# Patient Record
Sex: Female | Born: 1935 | Race: White | Hispanic: No | Marital: Married | Smoking: Never smoker
Health system: Southern US, Community
[De-identification: ages and names within clinical notes are randomized; demographics above are authoritative.]

## PROBLEM LIST (undated history)

## (undated) DIAGNOSIS — K219 Gastro-esophageal reflux disease without esophagitis: Secondary | ICD-10-CM

## (undated) DIAGNOSIS — M1990 Unspecified osteoarthritis, unspecified site: Secondary | ICD-10-CM

## (undated) DIAGNOSIS — I10 Essential (primary) hypertension: Secondary | ICD-10-CM

## (undated) DIAGNOSIS — R059 Cough, unspecified: Secondary | ICD-10-CM

## (undated) DIAGNOSIS — M810 Age-related osteoporosis without current pathological fracture: Secondary | ICD-10-CM

## (undated) DIAGNOSIS — K589 Irritable bowel syndrome without diarrhea: Secondary | ICD-10-CM

## (undated) DIAGNOSIS — E041 Nontoxic single thyroid nodule: Secondary | ICD-10-CM

## (undated) DIAGNOSIS — R05 Cough: Secondary | ICD-10-CM

## (undated) DIAGNOSIS — R51 Headache: Secondary | ICD-10-CM

## (undated) DIAGNOSIS — I739 Peripheral vascular disease, unspecified: Secondary | ICD-10-CM

## (undated) HISTORY — DX: Peripheral vascular disease, unspecified: I73.9

## (undated) HISTORY — DX: Irritable bowel syndrome, unspecified: K58.9

## (undated) HISTORY — PX: EYE SURGERY: SHX253

## (undated) HISTORY — DX: Age-related osteoporosis without current pathological fracture: M81.0

## (undated) HISTORY — PX: TUBAL LIGATION: SHX77

## (undated) HISTORY — PX: HAND SURGERY: SHX662

## (undated) HISTORY — PX: CHOLECYSTECTOMY: SHX55

## (undated) HISTORY — PX: WRIST SURGERY: SHX841

## (undated) HISTORY — PX: TONSILLECTOMY: SUR1361

## (undated) HISTORY — DX: Cough, unspecified: R05.9

## (undated) HISTORY — PX: APPENDECTOMY: SHX54

## (undated) HISTORY — DX: Cough: R05

---

## 2012-02-14 ENCOUNTER — Encounter (HOSPITAL_COMMUNITY): Payer: Self-pay

## 2012-02-15 ENCOUNTER — Encounter (HOSPITAL_COMMUNITY)
Admission: RE | Admit: 2012-02-15 | Discharge: 2012-02-15 | Disposition: A | Discharge status: Home / Self Care | Payer: Medicare Other | Source: Ambulatory Visit | Attending: Orthopedic Surgery | Admitting: Orthopedic Surgery

## 2012-02-15 ENCOUNTER — Encounter (HOSPITAL_COMMUNITY): Payer: Self-pay

## 2012-02-15 ENCOUNTER — Ambulatory Visit (HOSPITAL_COMMUNITY)
Admission: RE | Admit: 2012-02-15 | Discharge: 2012-02-15 | Disposition: A | Discharge status: Home / Self Care | Payer: Medicare Other | Source: Ambulatory Visit | Attending: Orthopedic Surgery | Admitting: Orthopedic Surgery

## 2012-02-15 DIAGNOSIS — S42213A Unspecified displaced fracture of surgical neck of unspecified humerus, initial encounter for closed fracture: Secondary | ICD-10-CM | POA: Insufficient documentation

## 2012-02-15 DIAGNOSIS — X58XXXA Exposure to other specified factors, initial encounter: Secondary | ICD-10-CM | POA: Insufficient documentation

## 2012-02-15 DIAGNOSIS — Z01818 Encounter for other preprocedural examination: Secondary | ICD-10-CM | POA: Insufficient documentation

## 2012-02-15 DIAGNOSIS — Z01812 Encounter for preprocedural laboratory examination: Secondary | ICD-10-CM | POA: Insufficient documentation

## 2012-02-15 DIAGNOSIS — Z01810 Encounter for preprocedural cardiovascular examination: Secondary | ICD-10-CM | POA: Insufficient documentation

## 2012-02-15 HISTORY — DX: Gastro-esophageal reflux disease without esophagitis: K21.9

## 2012-02-15 HISTORY — DX: Headache: R51

## 2012-02-15 HISTORY — DX: Unspecified osteoarthritis, unspecified site: M19.90

## 2012-02-15 HISTORY — DX: Essential (primary) hypertension: I10

## 2012-02-15 HISTORY — DX: Nontoxic single thyroid nodule: E04.1

## 2012-02-15 LAB — BASIC METABOLIC PANEL
Calcium: 8.9 mg/dL (ref 8.4–10.5)
GFR calc non Af Amer: >90 mL/min (ref >90)
Glucose, Bld: 108 mg/dL — ABNORMAL HIGH (ref 70–99)
Sodium: 132 mEq/L — ABNORMAL LOW (ref 135–145)

## 2012-02-15 LAB — CBC
MCH: 31.3 pg (ref 26.0–34.0)
Platelets: 370 10*3/uL (ref 150–400)
RBC: 3.36 MIL/uL — ABNORMAL LOW (ref 3.87–5.11)

## 2012-02-15 LAB — ABO/RH: ABO/RH(D): AB POS

## 2012-02-15 LAB — TYPE AND SCREEN: ABO/RH(D): AB POS

## 2012-02-15 NOTE — Pre-Procedure Instructions (Addendum)
[address] COHEN DOLEMAN  02/15/2012   Your procedure is scheduled on: 02/17/12  Report to [facility] at 800AM.  Call this number if you have problems the morning of surgery: [phone]   Remember:   Do not eat food or drink:After Midnight.    Take these medicines the morning of surgery with A SIP OF WATER: refluxin, pain med  STOP aspirin and ALL over the counter meds now   Do not wear jewelry, make-up or nail polish.  Do not wear lotions, powders, or perfumes. You may wear deodorant.  Do not shave 48 hours prior to surgery. Men may shave face and neck.  Do not bring valuables to the hospital.  Contacts, dentures or bridgework may not be worn into surgery.  Leave suitcase in the car. After surgery it may be brought to your room.  For patients admitted to the hospital, checkout time is 11:00 AM the day of discharge.   Patients discharged the day of surgery will not be allowed to drive home.  Name and phone number of your driver: Alden Server spouse [phone]  Special Instructions: Incentive Spirometry - Practice and bring it with you on the day of surgery. and CHG Shower Use Special Wash: 1/2 bottle night before surgery and 1/2 bottle morning of surgery.   Please read over the following fact sheets that you were given: Pain Booklet, Coughing and Deep Breathing, Blood Transfusion Information, MRSA Information and Surgical Site Infection Prevention

## 2012-02-15 NOTE — Progress Notes (Signed)
req'd stress done 3 yrs ago neg from [facility] cardiology

## 2012-02-16 MED ORDER — CEFAZOLIN SODIUM-DEXTROSE 2-3 GM-% IV SOLR
2.0000 g | INTRAVENOUS | Status: AC
  Administered 2012-02-17: 2 g via INTRAVENOUS
  Filled 2012-02-16: qty 50

## 2012-02-16 NOTE — Consult Note (Signed)
Anesthesia chart review: Patient is a 77 year old female scheduled for right shoulder reverse arthroplasty by Dr. Rennis Chris on 02/17/2012.  She has a right proximal humerus fracture.  PCP is Dr. Lucianne Lei [phone]).  Labs noted.  CXR on 02/15/12 showed: 1. No acute cardiopulmonary disease.  2. Hyperexpanded lungs with slightly asymmetric biapical pleural parenchymal thickening, right greater than left.  3. Minimally displaced impacted fracture of the surgical neck of the humerus with inferior dislocation of the humeral head in relation to the glenoid, incompletely evaluated.   EKG on 02/15/12 showed ST @ 106, possible LAE, LAD.  Patient had a stress test in 2010 (done at [facility] in [place], but ordered by PCP).  I left a voicemail for Dr. Novella Rob nurse requesting stress test and EKG as available.  Of note, she was evaluated by Cardiologist Dr. Woodfin Ganja on 09/26/10 for murmur evaluation.  His note mentions that she had a "stress test and Holter monitor several years ago that were all normal. She had an echo 2 years ago that was normal."  His exam indicates that he heard a soft 1/6 ejection murmur that did not suggest any significant valvular disease at that time.  No additional test were ordered then with recommendations for two year follow-up.  If no new/acute CV/CHF symptoms, then anticipate she can proceed as planned.   Shonna Chock, PA-C

## 2012-02-17 ENCOUNTER — Encounter (HOSPITAL_COMMUNITY)
Admission: RE | Disposition: A | Discharge status: Home Health | Payer: Self-pay | Source: Ambulatory Visit | Attending: Orthopedic Surgery

## 2012-02-17 ENCOUNTER — Encounter (HOSPITAL_COMMUNITY): Payer: Self-pay | Admitting: Vascular Surgery

## 2012-02-17 ENCOUNTER — Inpatient Hospital Stay (HOSPITAL_COMMUNITY): Payer: Medicare Other | Admitting: Vascular Surgery

## 2012-02-17 ENCOUNTER — Encounter (HOSPITAL_COMMUNITY): Payer: Self-pay | Admitting: *Deleted

## 2012-02-17 ENCOUNTER — Inpatient Hospital Stay (HOSPITAL_COMMUNITY)
Admission: RE | Admit: 2012-02-17 | Discharge: 2012-02-18 | DRG: 484 | Disposition: A | Discharge status: Home Health | Payer: Medicare Other | Source: Ambulatory Visit | Attending: Orthopedic Surgery | Admitting: Orthopedic Surgery

## 2012-02-17 DIAGNOSIS — Z01810 Encounter for preprocedural cardiovascular examination: Secondary | ICD-10-CM

## 2012-02-17 DIAGNOSIS — S42293A Other displaced fracture of upper end of unspecified humerus, initial encounter for closed fracture: Principal | ICD-10-CM | POA: Diagnosis present

## 2012-02-17 DIAGNOSIS — W19XXXA Unspecified fall, initial encounter: Secondary | ICD-10-CM | POA: Diagnosis present

## 2012-02-17 DIAGNOSIS — K219 Gastro-esophageal reflux disease without esophagitis: Secondary | ICD-10-CM | POA: Diagnosis present

## 2012-02-17 DIAGNOSIS — I10 Essential (primary) hypertension: Secondary | ICD-10-CM | POA: Diagnosis present

## 2012-02-17 DIAGNOSIS — Z01812 Encounter for preprocedural laboratory examination: Secondary | ICD-10-CM

## 2012-02-17 DIAGNOSIS — S42209A Unspecified fracture of upper end of unspecified humerus, initial encounter for closed fracture: Secondary | ICD-10-CM

## 2012-02-17 DIAGNOSIS — Z01818 Encounter for other preprocedural examination: Secondary | ICD-10-CM

## 2012-02-17 DIAGNOSIS — M129 Arthropathy, unspecified: Secondary | ICD-10-CM | POA: Diagnosis present

## 2012-02-17 HISTORY — PX: REVERSE SHOULDER ARTHROPLASTY: SHX5054

## 2012-02-17 SURGERY — ARTHROPLASTY, SHOULDER, TOTAL, REVERSE
Anesthesia: General | Site: Shoulder | Laterality: Right | Wound class: Clean

## 2012-02-17 MED ORDER — CEFAZOLIN SODIUM 1-5 GM-% IV SOLN
1.0000 g | Freq: Four times a day (QID) | INTRAVENOUS | Status: AC
  Administered 2012-02-17 – 2012-02-18 (×3): 1 g via INTRAVENOUS
  Filled 2012-02-17 (×3): qty 50

## 2012-02-17 MED ORDER — ONDANSETRON HCL 4 MG/2ML IJ SOLN
INTRAMUSCULAR | Status: DC | PRN
  Administered 2012-02-17: 4 mg via INTRAVENOUS

## 2012-02-17 MED ORDER — PHENOL 1.4 % MT LIQD: 1.0000 | OROMUCOSAL | Status: DC | PRN

## 2012-02-17 MED ORDER — METOCLOPRAMIDE HCL 5 MG/ML IJ SOLN: 5.0000 mg | Freq: Three times a day (TID) | INTRAMUSCULAR | Status: DC | PRN

## 2012-02-17 MED ORDER — FENTANYL CITRATE 0.05 MG/ML IJ SOLN
INTRAMUSCULAR | Status: DC | PRN
  Administered 2012-02-17: 100 ug via INTRAVENOUS

## 2012-02-17 MED ORDER — ONDANSETRON HCL 4 MG/2ML IJ SOLN: 4.0000 mg | Freq: Four times a day (QID) | INTRAMUSCULAR | Status: DC | PRN

## 2012-02-17 MED ORDER — PHENYLEPHRINE HCL 10 MG/ML IJ SOLN
INTRAMUSCULAR | Status: DC | PRN
  Administered 2012-02-17: 120 ug via INTRAVENOUS
  Administered 2012-02-17: 80 ug via INTRAVENOUS

## 2012-02-17 MED ORDER — KETOROLAC TROMETHAMINE 15 MG/ML IJ SOLN
15.0000 mg | Freq: Four times a day (QID) | INTRAMUSCULAR | Status: DC
  Administered 2012-02-17 – 2012-02-18 (×3): 15 mg via INTRAVENOUS
  Filled 2012-02-17 (×7): qty 1

## 2012-02-17 MED ORDER — MUPIROCIN 2 % EX OINT
1.0000 "application " | TOPICAL_OINTMENT | Freq: Two times a day (BID) | CUTANEOUS | Status: DC
  Administered 2012-02-17 – 2012-02-18 (×2): 1 via NASAL
  Filled 2012-02-17: qty 22

## 2012-02-17 MED ORDER — TEMAZEPAM 15 MG PO CAPS: 15.0000 mg | ORAL_CAPSULE | Freq: Every evening | ORAL | Status: DC | PRN

## 2012-02-17 MED ORDER — ACETAMINOPHEN 325 MG PO TABS: 650.0000 mg | ORAL_TABLET | Freq: Four times a day (QID) | ORAL | Status: DC | PRN

## 2012-02-17 MED ORDER — CHLORHEXIDINE GLUCONATE 4 % EX LIQD: 60.0000 mL | Freq: Once | CUTANEOUS | Status: DC

## 2012-02-17 MED ORDER — ACETAMINOPHEN 650 MG RE SUPP: 650.0000 mg | Freq: Four times a day (QID) | RECTAL | Status: DC | PRN

## 2012-02-17 MED ORDER — HYDROMORPHONE HCL PF 1 MG/ML IJ SOLN: 0.2500 mg | INTRAMUSCULAR | Status: DC | PRN

## 2012-02-17 MED ORDER — DOCUSATE SODIUM 100 MG PO CAPS
100.0000 mg | ORAL_CAPSULE | Freq: Two times a day (BID) | ORAL | Status: DC
  Administered 2012-02-17: 100 mg via ORAL
  Filled 2012-02-17 (×3): qty 1

## 2012-02-17 MED ORDER — METHOCARBAMOL 500 MG PO TABS
500.0000 mg | ORAL_TABLET | Freq: Four times a day (QID) | ORAL | Status: DC | PRN
  Administered 2012-02-18: 500 mg via ORAL
  Filled 2012-02-17 (×2): qty 1

## 2012-02-17 MED ORDER — OXYCODONE-ACETAMINOPHEN 5-325 MG PO TABS
1.0000 | ORAL_TABLET | ORAL | Status: DC | PRN
  Administered 2012-02-18 (×2): 2 via ORAL
  Filled 2012-02-17 (×2): qty 2

## 2012-02-17 MED ORDER — METOCLOPRAMIDE HCL 10 MG PO TABS: 5.0000 mg | ORAL_TABLET | Freq: Three times a day (TID) | ORAL | Status: DC | PRN

## 2012-02-17 MED ORDER — ONDANSETRON HCL 4 MG PO TABS: 4.0000 mg | ORAL_TABLET | Freq: Four times a day (QID) | ORAL | Status: DC | PRN

## 2012-02-17 MED ORDER — MIDAZOLAM HCL 2 MG/2ML IJ SOLN
INTRAMUSCULAR | Status: AC
  Filled 2012-02-17: qty 2

## 2012-02-17 MED ORDER — LACTATED RINGERS IV SOLN
INTRAVENOUS | Status: DC | PRN
  Administered 2012-02-17 (×2): via INTRAVENOUS

## 2012-02-17 MED ORDER — VANCOMYCIN HCL 1000 MG IV SOLR
1000.0000 mg | INTRAVENOUS | Status: DC | PRN
  Administered 2012-02-17: 1000 mg via INTRAVENOUS

## 2012-02-17 MED ORDER — PROPOFOL 10 MG/ML IV BOLUS
INTRAVENOUS | Status: DC | PRN
  Administered 2012-02-17: 140 mg via INTRAVENOUS

## 2012-02-17 MED ORDER — LOSARTAN POTASSIUM 25 MG PO TABS
25.0000 mg | ORAL_TABLET | Freq: Every day | ORAL | Status: DC
  Filled 2012-02-17 (×2): qty 1

## 2012-02-17 MED ORDER — HYDROMORPHONE HCL PF 1 MG/ML IJ SOLN
0.5000 mg | INTRAMUSCULAR | Status: DC | PRN
  Administered 2012-02-18: 1 mg via INTRAVENOUS
  Filled 2012-02-17: qty 1

## 2012-02-17 MED ORDER — MENTHOL 3 MG MT LOZG: 1.0000 | LOZENGE | OROMUCOSAL | Status: DC | PRN

## 2012-02-17 MED ORDER — SODIUM CHLORIDE 0.9 % IR SOLN
Status: DC | PRN
  Administered 2012-02-17: 1000 mL

## 2012-02-17 MED ORDER — PHENYLEPHRINE HCL 10 MG/ML IJ SOLN
10.0000 mg | INTRAVENOUS | Status: DC | PRN
  Administered 2012-02-17: 50 ug/min via INTRAVENOUS

## 2012-02-17 MED ORDER — LIDOCAINE HCL (CARDIAC) 20 MG/ML IV SOLN
INTRAVENOUS | Status: DC | PRN
  Administered 2012-02-17: 80 mg via INTRAVENOUS

## 2012-02-17 MED ORDER — MIDAZOLAM HCL 5 MG/ML IJ SOLN
1.0000 mg | Freq: Once | INTRAMUSCULAR | Status: AC
  Administered 2012-02-17: 1 mg via INTRAVENOUS

## 2012-02-17 MED ORDER — LACTATED RINGERS IV SOLN
INTRAVENOUS | Status: DC
  Administered 2012-02-17: 1000 mL via INTRAVENOUS
  Administered 2012-02-18: 03:00:00 via INTRAVENOUS

## 2012-02-17 MED ORDER — VANCOMYCIN HCL IN DEXTROSE 1-5 GM/200ML-% IV SOLN
INTRAVENOUS | Status: AC
  Filled 2012-02-17: qty 200

## 2012-02-17 MED ORDER — ALUM & MAG HYDROXIDE-SIMETH 200-200-20 MG/5ML PO SUSP: 30.0000 mL | ORAL | Status: DC | PRN

## 2012-02-17 MED ORDER — FENTANYL CITRATE 0.05 MG/ML IJ SOLN
50.0000 ug | Freq: Once | INTRAMUSCULAR | Status: AC
  Administered 2012-02-17: 50 ug via INTRAVENOUS

## 2012-02-17 MED ORDER — ROCURONIUM BROMIDE 100 MG/10ML IV SOLN
INTRAVENOUS | Status: DC | PRN
  Administered 2012-02-17: 50 mg via INTRAVENOUS

## 2012-02-17 MED ORDER — NEOSTIGMINE METHYLSULFATE 1 MG/ML IJ SOLN
INTRAMUSCULAR | Status: DC | PRN
  Administered 2012-02-17: 3 mg via INTRAVENOUS

## 2012-02-17 MED ORDER — LACTATED RINGERS IV SOLN
INTRAVENOUS | Status: DC
  Administered 2012-02-17: 50 mL/h via INTRAVENOUS

## 2012-02-17 MED ORDER — FENTANYL CITRATE 0.05 MG/ML IJ SOLN
INTRAMUSCULAR | Status: AC
  Filled 2012-02-17: qty 2

## 2012-02-17 MED ORDER — DIPHENHYDRAMINE HCL 12.5 MG/5ML PO ELIX: 12.5000 mg | ORAL_SOLUTION | ORAL | Status: DC | PRN

## 2012-02-17 MED ORDER — METHOCARBAMOL 100 MG/ML IJ SOLN
500.0000 mg | Freq: Four times a day (QID) | INTRAMUSCULAR | Status: DC | PRN
  Administered 2012-02-17: 500 mg via INTRAVENOUS
  Filled 2012-02-17: qty 5

## 2012-02-17 MED ORDER — BUPIVACAINE-EPINEPHRINE PF 0.5-1:200000 % IJ SOLN
INTRAMUSCULAR | Status: DC | PRN
  Administered 2012-02-17: 30 mL

## 2012-02-17 MED ORDER — GLYCOPYRROLATE 0.2 MG/ML IJ SOLN
INTRAMUSCULAR | Status: DC | PRN
  Administered 2012-02-17: 0.4 mg via INTRAVENOUS

## 2012-02-17 SURGICAL SUPPLY — 75 items
BIT DRILL 170X2.5X (BIT) IMPLANT
BIT DRL 170X2.5X (BIT)
BLADE SAW SGTL 83.5X18.5 (BLADE) ×2 IMPLANT
BRUSH FEMORAL CANAL (MISCELLANEOUS) IMPLANT
CEMENT BONE DEPUY (Cement) ×4 IMPLANT
CEMENT RESTRICTOR DEPUY SZ 3 (Cement) ×2 IMPLANT
CLOTH BEACON ORANGE TIMEOUT ST (SAFETY) ×2 IMPLANT
CLSR STERI-STRIP ANTIMIC 1/2X4 (GAUZE/BANDAGES/DRESSINGS) ×2 IMPLANT
COVER SURGICAL LIGHT HANDLE (MISCELLANEOUS) ×2 IMPLANT
DRAPE INCISE IOBAN 66X45 STRL (DRAPES) ×4 IMPLANT
DRAPE SURG 17X11 SM STRL (DRAPES) ×2 IMPLANT
DRAPE U-SHAPE 47X51 STRL (DRAPES) ×2 IMPLANT
DRILL 2.5 (BIT)
DRILL BIT 7/64X5 (BIT) IMPLANT
DRSG AQUACEL AG ADV 3.5X10 (GAUZE/BANDAGES/DRESSINGS) IMPLANT
DRSG MEPILEX BORDER 4X8 (GAUZE/BANDAGES/DRESSINGS) IMPLANT
DURAPREP 26ML APPLICATOR (WOUND CARE) ×2 IMPLANT
ELECT BLADE 4.0 EZ CLEAN MEGAD (MISCELLANEOUS) ×2
ELECT CAUTERY BLADE 6.4 (BLADE) ×2 IMPLANT
ELECT REM PT RETURN 9FT ADLT (ELECTROSURGICAL) ×2
ELECTRODE BLDE 4.0 EZ CLN MEGD (MISCELLANEOUS) ×1 IMPLANT
ELECTRODE REM PT RTRN 9FT ADLT (ELECTROSURGICAL) ×1 IMPLANT
FACESHIELD LNG OPTICON STERILE (SAFETY) ×6 IMPLANT
GLOVE BIO SURGEON STRL SZ7.5 (GLOVE) ×2 IMPLANT
GLOVE BIO SURGEON STRL SZ8 (GLOVE) ×2 IMPLANT
GLOVE BIO SURGEON STRL SZ8.5 (GLOVE) ×2 IMPLANT
GLOVE BIOGEL PI IND STRL 8 (GLOVE) ×1 IMPLANT
GLOVE BIOGEL PI INDICATOR 8 (GLOVE) ×1
GLOVE EUDERMIC 7 POWDERFREE (GLOVE) ×2 IMPLANT
GLOVE SS BIOGEL STRL SZ 7.5 (GLOVE) ×1 IMPLANT
GLOVE SUPERSENSE BIOGEL SZ 7.5 (GLOVE) ×1
GLOVE SURG SS PI 7.5 STRL IVOR (GLOVE) ×2 IMPLANT
GLOVE SURG SS PI 8.5 STRL IVOR (GLOVE) ×1
GLOVE SURG SS PI 8.5 STRL STRW (GLOVE) ×1 IMPLANT
GOWN PREVENTION PLUS XXLARGE (GOWN DISPOSABLE) ×2 IMPLANT
GOWN STRL NON-REIN LRG LVL3 (GOWN DISPOSABLE) IMPLANT
GOWN STRL REIN XL XLG (GOWN DISPOSABLE) ×6 IMPLANT
HANDPIECE INTERPULSE COAX TIP (DISPOSABLE)
KIT BASIN OR (CUSTOM PROCEDURE TRAY) ×2 IMPLANT
KIT ROOM TURNOVER OR (KITS) ×2 IMPLANT
MANIFOLD NEPTUNE II (INSTRUMENTS) ×2 IMPLANT
NDL SUT 6 .5 CRC .975X.05 MAYO (NEEDLE) ×1 IMPLANT
NEEDLE HYPO 25GX1X1/2 BEV (NEEDLE) IMPLANT
NEEDLE MAYO TAPER (NEEDLE) ×1
NS IRRIG 1000ML POUR BTL (IV SOLUTION) ×2 IMPLANT
PACK SHOULDER (CUSTOM PROCEDURE TRAY) ×2 IMPLANT
PAD ARMBOARD 7.5X6 YLW CONV (MISCELLANEOUS) ×4 IMPLANT
PASSER SUT SWANSON 36MM LOOP (INSTRUMENTS) IMPLANT
PIN GUIDE 1.2 (PIN) IMPLANT
PIN GUIDE GLENOPHERE 1.5MX300M (PIN) IMPLANT
PIN METAGLENE 2.5 (PIN) IMPLANT
PRESSURIZER FEMORAL UNIV (MISCELLANEOUS) IMPLANT
SET HNDPC FAN SPRY TIP SCT (DISPOSABLE) IMPLANT
SLING ARM IMMOBILIZER LRG (SOFTGOODS) ×2 IMPLANT
SPONGE LAP 18X18 X RAY DECT (DISPOSABLE) ×2 IMPLANT
SPONGE LAP 4X18 X RAY DECT (DISPOSABLE) ×2 IMPLANT
STRIP CLOSURE SKIN 1/2X4 (GAUZE/BANDAGES/DRESSINGS) IMPLANT
SUCTION FRAZIER TIP 10 FR DISP (SUCTIONS) ×2 IMPLANT
SUT BONE WAX W31G (SUTURE) IMPLANT
SUT FIBERWIRE #2 38 T-5 BLUE (SUTURE) ×8
SUT MNCRL AB 3-0 PS2 18 (SUTURE) ×2 IMPLANT
SUT VIC AB 1 CT1 27 (SUTURE) ×1
SUT VIC AB 1 CT1 27XBRD ANBCTR (SUTURE) ×1 IMPLANT
SUT VIC AB 2-0 CT1 27 (SUTURE) ×1
SUT VIC AB 2-0 CT1 TAPERPNT 27 (SUTURE) ×1 IMPLANT
SUT VIC AB 2-0 SH 27 (SUTURE)
SUT VIC AB 2-0 SH 27X BRD (SUTURE) IMPLANT
SUTURE FIBERWR #2 38 T-5 BLUE (SUTURE) ×4 IMPLANT
SYR 30ML SLIP (SYRINGE) IMPLANT
SYR CONTROL 10ML LL (SYRINGE) IMPLANT
TOWEL OR 17X24 6PK STRL BLUE (TOWEL DISPOSABLE) ×2 IMPLANT
TOWEL OR 17X26 10 PK STRL BLUE (TOWEL DISPOSABLE) ×2 IMPLANT
TOWER CARTRIDGE SMART MIX (DISPOSABLE) ×2 IMPLANT
TRAY FOLEY CATH 14FR (SET/KITS/TRAYS/PACK) IMPLANT
WATER STERILE IRR 1000ML POUR (IV SOLUTION) ×2 IMPLANT

## 2012-02-17 NOTE — Anesthesia Preprocedure Evaluation (Addendum)
Anesthesia Evaluation  Patient identified by MRN, date of birth, ID band Patient awake    Reviewed: Allergy & Precautions, H&P , NPO status , Patient's Chart, lab work & pertinent test results, reviewed documented beta blocker date and time   Airway Mallampati: II  Neck ROM: full    Dental  (+) Teeth Intact and Dental Advisory Given   Pulmonary          Cardiovascular hypertension, Pt. on medications     Neuro/Psych  Headaches,    GI/Hepatic GERD-  Medicated and Controlled,  Endo/Other  Morbid obesity  Renal/GU      Musculoskeletal  (+) Arthritis -,   Abdominal   Peds  Hematology   Anesthesia Other Findings   Reproductive/Obstetrics                          Anesthesia Physical Anesthesia Plan  ASA: III  Anesthesia Plan: General   Post-op Pain Management: MAC Combined w/ Regional for Post-op pain   Induction: Intravenous  Airway Management Planned: Oral ETT  Additional Equipment:   Intra-op Plan:   Post-operative Plan: Extubation in OR  Informed Consent: I have reviewed the patients History and Physical, chart, labs and discussed the procedure including the risks, benefits and alternatives for the proposed anesthesia with the patient or authorized representative who has indicated his/her understanding and acceptance.   Dental advisory given  Plan Discussed with: CRNA and Surgeon  Anesthesia Plan Comments:        Anesthesia Quick Evaluation

## 2012-02-17 NOTE — Transfer of Care (Signed)
Immediate Anesthesia Transfer of Care Note  Patient: Lori [name]  Procedure(s) Performed: Procedure(s) (LRB) with comments: REVERSE SHOULDER ARTHROPLASTY (Right) - right reverse shoulder arthroplasty  Patient Location: PACU  Anesthesia Type: General and Regional  Level of Consciousness: sedated  Airway & Oxygen Therapy: Patient Spontanous Breathing and Patient connected to nasal cannula oxygen  Post-op Assessment: Report given to PACU RN and Post -op Vital signs reviewed and stable  Post vital signs: Reviewed  Complications: No apparent anesthesia complications

## 2012-02-17 NOTE — Op Note (Signed)
02/17/2012  12:43 PM  PATIENT:   Lori [name]  77 y.o. female  PRE-OPERATIVE DIAGNOSIS: displaced 3 part RIGHT PROXIMAL HUMERUS FRACTURE  POST-OPERATIVE DIAGNOSIS:  same  PROCEDURE:  Reverse shoulder arthroplasty cemented 10 stem, +9 poly, 38 eccentric glenosphere  SURGEON:  Bladyn Tipps, Vania Rea M.D.  ASSISTANTS: Shuford pac   ANESTHESIA:   GET + ISB  EBL: 250  SPECIMEN:  none  Drains: none   PATIENT DISPOSITION:  PACU - hemodynamically stable.    PLAN OF CARE: Admit to inpatient   Dictation# [id number] [phone]

## 2012-02-17 NOTE — Anesthesia Postprocedure Evaluation (Signed)
  Anesthesia Post-op Note  Patient: Lori [name]  Procedure(s) Performed: Procedure(s) (LRB) with comments: REVERSE SHOULDER ARTHROPLASTY (Right) - right reverse shoulder arthroplasty  Patient Location: PACU  Anesthesia Type: General, Regional and GA combined with regional for post-op pain  Level of Consciousness: awake, alert , oriented and patient cooperative  Airway and Oxygen Therapy: Patient Spontanous Breathing and Patient connected to nasal cannula oxygen  Post-op Pain: none  Post-op Assessment: Post-op Vital signs reviewed, Patient's Cardiovascular Status Stable, Respiratory Function Stable, Patent Airway, No signs of Nausea or vomiting and Pain level controlled  Post-op Vital Signs: stable  Complications: No apparent anesthesia complications

## 2012-02-17 NOTE — Preoperative (Signed)
Beta Blockers   Reason not to administer Beta Blockers:Not Applicable. No home beta blockers 

## 2012-02-17 NOTE — H&P (Signed)
Lori [name]    Chief Complaint: RIGHT PROXIMAL HUMERUS FRACTURE HPI: The patient is a 77 y.o. female with a displaced right 3 part proximal humerus fracture  Past Medical History  Diagnosis Date  . Hypertension   . Thyroid nodule   . GERD (gastroesophageal reflux disease)   . Headache     hx  . Arthritis     ra    Past Surgical History  Procedure Date  . Hand surgery     prosthesis rt  . Wrist surgery     rt  . Cholecystectomy   . Appendectomy   . Cesarean section   . Eye surgery     bil cat  . Tonsillectomy   . Tubal ligation     History reviewed. No pertinent family history.  Social History:  reports that she has never smoked. She does not have any smokeless tobacco history on file. She reports that she does not drink alcohol or use illicit drugs.  Allergies: No Known Allergies  Medications Prior to Admission  Medication Sig Dispense Refill  . acetaminophen (TYLENOL) 500 MG tablet Take 500 mg by mouth every 6 (six) hours as needed.      Marland Kitchen losartan (COZAAR) 25 MG tablet Take 25 mg by mouth daily.      . mupirocin ointment (BACTROBAN) 2 % Place 1 application into the nose 2 (two) times daily.      Marland Kitchen OVER THE COUNTER MEDICATION Place 1 drop into both eyes every 4 (four) hours as needed. FOR DRY EYES      . PRESCRIPTION MEDICATION Take 1 tablet by mouth 3 (three) times daily after meals. REFLUXIN      . Ascorbic Acid (VITAMIN C) 1000 MG tablet Take 1,000 mg by mouth 2 (two) times daily.      Marland Kitchen aspirin EC 81 MG tablet Take 81 mg by mouth daily.      . B Complex Vitamins (VITAMIN B COMPLEX PO) Take 1 tablet by mouth daily.      . Coenzyme Q10 200 MG capsule Take 200 mg by mouth daily.      . Cyanocobalamin (VITAMIN B-12 PO) Take 1 tablet by mouth daily.      . Multiple Vitamins-Minerals (MULTIVITAMINS THER. W/MINERALS) TABS Take 1 tablet by mouth daily.      . niacin (NIASPAN) 500 MG CR tablet Take 500 mg by mouth at bedtime.      . niacin 500 MG CR capsule Take  500 mg by mouth 2 (two) times daily.      Marland Kitchen OVER THE COUNTER MEDICATION Take 1 tablet by mouth daily. IDORAL      . OVER THE COUNTER MEDICATION Take 1 tablet by mouth 2 (two) times daily with a meal. ALPHAGEST 3      . OVER THE COUNTER MEDICATION Take 1 tablet by mouth daily. THYRO-DINE      . OVER THE COUNTER MEDICATION Take 1 tablet by mouth daily. ADRENOGEN      . oxycodone (OXY-IR) 5 MG capsule Take 5 mg by mouth every 4 (four) hours as needed.         Physical Exam: right shoulder with painful and restricted ROM with xray showing severly displaced proximal humerus fracture  Vitals  Temp:  [97.6 F (36.4 C)] 97.6 F (36.4 C) (09/19 0830) Pulse Rate:  [79] 79  (09/19 0830) Resp:  [18] 18  (09/19 0830) BP: (151)/(80) 151/80 mmHg (09/19 0830) SpO2:  [80 %] 80 % (09/19 0830)  Assessment/Plan  Impression: RIGHT PROXIMAL HUMERUS FRACTURE  Plan of Action: Procedure(s): REVERSE SHOULDER ARTHROPLASTY  Lori [name] 02/17/2012, 9:59 AM

## 2012-02-17 NOTE — Anesthesia Procedure Notes (Addendum)
Anesthesia Regional Block:  Interscalene brachial plexus block  Pre-Anesthetic Checklist: ,, timeout performed, Correct Patient, Correct Site, Correct Laterality, Correct Procedure, Correct Position, site marked, Risks and benefits discussed,  Surgical consent,  Pre-op evaluation,  At surgeon's request and post-op pain management  Laterality: Right  Prep: chloraprep       Needles:  Injection technique: Single-shot  Needle Type: Echogenic Stimulator Needle     Needle Length: 5cm 5 cm Needle Gauge: 22 and 22 G    Additional Needles:  Procedures: ultrasound guided and nerve stimulator Interscalene brachial plexus block  Nerve Stimulator or Paresthesia:  Response: biceps flexion, 0.45 mA,   Additional Responses:   Narrative:  Start time: 02/17/2012 9:40 AM End time: 02/17/2012 9:54 AM Injection made incrementally with aspirations every 5 mL.  Performed by: Personally  Anesthesiologist: Dr Chaney Malling  Additional Notes: Functioning IV was confirmed and monitors were applied.  A 50mm 22ga Arrow echogenic stimulator needle was used. Sterile prep and drape,hand hygiene and sterile gloves were used.  Negative aspiration and negative test dose prior to incremental administration of local anesthetic. The patient tolerated the procedure well.  Ultrasound guidance: relevent anatomy identified, needle position confirmed, local anesthetic spread visualized around nerve(s), vascular puncture avoided.  Image printed for medical record.   Interscalene brachial plexus block Procedure Name: Intubation Date/Time: 02/17/2012 10:45 AM Performed by: Garen Lah Pre-anesthesia Checklist: Patient identified, Timeout performed, Emergency Drugs available, Suction available and Patient being monitored Patient Re-evaluated:Patient Re-evaluated prior to inductionOxygen Delivery Method: Circle system utilized Preoxygenation: Pre-oxygenation with 100% oxygen Intubation Type: IV induction Ventilation:  Mask ventilation without difficulty Laryngoscope Size: Mac and 3 Grade View: Grade II Tube type: Oral Tube size: 7.5 mm Number of attempts: 1 Airway Equipment and Method: Stylet Placement Confirmation: positive ETCO2,  breath sounds checked- equal and bilateral and ETT inserted through vocal cords under direct vision Secured at: 21 cm Tube secured with: Tape Dental Injury: Teeth and Oropharynx as per pre-operative assessment

## 2012-02-17 NOTE — Progress Notes (Signed)
Report given to shelly rn as caregiver 

## 2012-02-18 ENCOUNTER — Encounter (HOSPITAL_COMMUNITY): Payer: Self-pay | Admitting: Orthopedic Surgery

## 2012-02-18 DIAGNOSIS — S42209A Unspecified fracture of upper end of unspecified humerus, initial encounter for closed fracture: Secondary | ICD-10-CM

## 2012-02-18 MED ORDER — OXYCODONE-ACETAMINOPHEN 5-325 MG PO TABS: 1.0000 | ORAL_TABLET | ORAL | Status: AC | PRN

## 2012-02-18 MED ORDER — DIAZEPAM 5 MG PO TABS: 5.0000 mg | ORAL_TABLET | Freq: Four times a day (QID) | ORAL | Status: AC | PRN

## 2012-02-18 NOTE — Op Note (Signed)
Lori, [name] NO.:  [id number]  MEDICAL RECORD NO.:  [id number]  LOCATION:  5N12C                        FACILITY:  [facility]  PHYSICIAN:  Vania Rea. Ron Junco, M.D.  DATE OF BIRTH:  07-25-1934  DATE OF PROCEDURE:  02/17/2012 DATE OF DISCHARGE:                              OPERATIVE REPORT   ADDENDUM:  French Ana A. Shuford, PA-C, was used in assistance throughout this case.  Potential for help with positioning of the extremity, tissue retraction, exposure, tissue manipulation, implantation of the implant, wound closer, and intraoperative decision making.     Vania Rea. Cahterine Heinzel, M.D.     KMS/MEDQ  D:  02/17/2012  T:  02/18/2012  Job:  [id number]

## 2012-02-18 NOTE — Progress Notes (Signed)
D/c instructions given to patient and husband. RX x 2 given. No hh services or equipment needed. All questions answered. Pt d/c'ed via wheelchair in stable condition

## 2012-02-18 NOTE — Discharge Summary (Signed)
PATIENT ID:      Lori [name]  MRN:     [id number] DOB/AGE:    [birth year] / 77 y.o.     DISCHARGE SUMMARY  ADMISSION DATE:    02/17/2012 DISCHARGE DATE:   02/18/2012   ADMISSION DIAGNOSIS: RIGHT PROXIMAL HUMERUS FRACTURE  (RIGHT PROXIMAL HUMERUS FRACTURE)  DISCHARGE DIAGNOSIS:  RIGHT PROXIMAL HUMERUS FRACTURE    ADDITIONAL DIAGNOSIS: Principal Problem:  *Fracture of proximal humerus  Past Medical History  Diagnosis Date  . Hypertension   . Thyroid nodule   . GERD (gastroesophageal reflux disease)   . Headache     hx  . Arthritis     ra    PROCEDURE: Procedure(s): REVERSE SHOULDER ARTHROPLASTY on 02/17/2012  CONSULTS:     HISTORY:  See H&P in chart  HOSPITAL COURSE:  [name] is a 77 y.o. admitted on 02/17/2012 and found to have a diagnosis of RIGHT PROXIMAL HUMERUS FRACTURE.  After appropriate laboratory studies were obtained  they were taken to the operating room on 02/17/2012 and underwent Procedure(s): REVERSE SHOULDER ARTHROPLASTY.   They were given perioperative antibiotics:  Anti-infectives     Start     Dose/Rate Route Frequency Ordered Stop   02/17/12 1515   ceFAZolin (ANCEF) IVPB 1 g/50 mL premix        1 g 100 mL/hr over 30 Minutes Intravenous Every 6 hours 02/17/12 1513 02/18/12 0348   02/17/12 0500   ceFAZolin (ANCEF) IVPB 2 g/50 mL premix        2 g 100 mL/hr over 30 Minutes Intravenous 60 min pre-op 02/16/12 1406 02/17/12 1037        . Blood products given:none  Pt was doing well post op day 1 with good pain control. OT worked with patient and husband. The remainder of the hospital course was dedicated to ambulation and strengthening.   The patient was discharged on 1 Day Post-Op in  Good condition.

## 2012-02-18 NOTE — Progress Notes (Signed)
UR COMPLETED  

## 2012-02-18 NOTE — Op Note (Signed)
Lori [name], SCHRINER NO.:  [id number]  MEDICAL RECORD NO.:  [id number]  LOCATION:  5N12C                        FACILITY:  [facility]  PHYSICIAN:  Vania Rea. Caylin Raby, M.D.  DATE OF BIRTH:  1934/07/09  DATE OF PROCEDURE:  02/17/2012 DATE OF DISCHARGE:                              OPERATIVE REPORT   PREOPERATIVE DIAGNOSIS:  Severely displaced right 3-part proximal humerus fracture.  POSTOPERATIVE DIAGNOSIS:  Severely displaced right 3-part proximal humerus fracture.  PROCEDURE:  Right shoulder reverse arthroplasty utilizing a cemented size 10 DePuy stem with a +9 poly and a 38 eccentric glenosphere.  SURGEON:  Vania Rea. Wendle Kina, MD  ASSISTANT:  Lucita Lora. Shuford, PA-C  ANESTHESIA:  General endotracheal as well as interscalene block.  ESTIMATED BLOOD LOSS:  250 mL.  DRAINS:  None.  HISTORY:  Ms. Ryans is a 77 year old female with past history of rheumatoid arthritis, who fell almost 2 weeks ago, sustaining a nondisplaced right superior and inferior pubic rami fractures as well as a severely displaced right 3-part proximal humerus fracture.  She was seen at outside facility and referred to our office for review of the radiographs as well as physical examination confirmed a severely displaced nature of the fracture.  Given Ms. Shur's overall level of health and associated medical comorbidities and the degree of displacement and likely underlying osteoporosis, felt she would be a candidate for reverse arthroplasty for treatment of this severe injury.  Briefly, I counseled Ms. Neva Seat and family on treatment options as well as risks versus benefits thereof.  Possible surgical complications were reviewed including potential for bleeding, infection, neurovascular injury, persistent pain, loss of fixation, failure of the implant, anesthetic complication, possible need for additional surgery.  She understands and accepts and agrees with our planned  procedure.  PROCEDURE IN DETAIL:  After undergoing routine preop evaluation, the patient received prophylactic antibiotics and an interscalene block was established in the holding area by the Anesthesia Department.  Placed supine on the op table, underwent smooth induction of a general endotracheal anesthesia.  Placed in the beach chair position and appropriately padded and protected.  The right shoulder girdle region was then sterilely prepped and draped in standard fashion.  Time-out was called.  An anterior deltopectoral approach was made through a 15 cm incision beginning at the coracoid process and extending laterally and distally.  Skin flaps were elevated, and the cephalic vein was identified, carefully dissected and retracted laterally with the deltoid and the interval was developed distally in the upper cm.  The pec major was tenotomized for enhanced exposure.  We then identified the conjoined tendon which was carefully mobilized and retracted medially.  Adhesions in the subacromial/subdeltoid bursa were all then divided.  At this point, the biceps tendon was identified.  The bicipital groove was unroofed.  It was tenotomized and reflected distally for later tenodesis.  We then split the rotator interval on base of the coracoid and then evaluated the fracture which showed that the articular segment was severely impacted and displaced.  Given the fracture pattern, I divided the subscapularis away from its insertion into the lesser tuberosity and essentially found a nondisplaced fracture along the lesser tuberosity.  I used an osteotome  to divide the articular segment from the surrounding lesser tuberosity and the articular segment was then removed as a single piece.  The greater tuberosity was then mobilized and prominent bone fragment impeding our reduction.  We then removed with rongeur.  Then I passed tag sutures through the bone-tendon junction of the supra and infraspinatus  using #2 FiberWire.  The tuberosities were then reflected posteriorly.  The remnant of the lesser tuberosity which again had nondisplaced fracture fragment was excised. At this point, we had excellent exposure of the glenoid which I prepared by removing the peripheral rim of labrum in the proximal stump of the biceps and then placed a central guide pin which we then reamed to a subchondral bony bed.  Peripheral reamer was used.  Surrounding soft tissues were cleared, again care taken to gain complete exposure of the periphery of the glenoid.  A central drill hole was then made.  We impacted our glenoid base plate and then transfixed it using standard technique with 3 locking screws in the inferior, superior, and anterior holes.  Posterior pole did not have adequate bone for fixation.  Good fixation was achieved and once the screws were terminally seated, they were all then locked into position.  We then placed the 38 eccentric glenosphere, which achieved excellent fixation much to our satisfaction. At this point, we then returned our attention to the humeral shaft where hand reaming was performed up to size 10.  We chose the appropriate fit. I then placed a humeral stem trial and had to perform some additional contouring of the bone.  Ultimately we utilized the size 10 reaming stem and used the size 1 reamer to remove some residual bone posteriorly and once this was completed, we achieved appropriate seating of the trial stem at approximately 0 degrees retroversion and performed a trialing off this which confirmed that the +3 poly would fit nicely.  At this point, we placed a cement plug into the humeral canal.  The canal was irrigated and cleaned.  Cement was mixed and the appropriate consistency was introduced in retrograde fashion.  It was then hand packed and then the size 10 stem was introduced to appropriate depth, again maintaining 0 degrees of retroversion.  All extra cement was  meticulously cleaned. Once the cement had hardened, we then performed a series of trial reductions and ultimately found that the +9 poly showed the best soft tissue balance and so finally the final +9 poly was placed into the humeral stem.  Final reduction was performed.  This showed excellent soft tissue balance with good stability of the shoulder.  I should mention that I passed 2 FiberWire sutures through the humeral shaft prior to implantation of the humeral stem and then these sutures were then used to supplement the fixation of the greater tuberosity down to the humeral shaft and the previously placed sutures were also oversewn creating additional stability between the greater tuberosity segment in the humeral shaft and then the subscapularis was repaired to the sutures in the humeral shaft as well allowing excellent closure of the soft tissue envelope as well as the tuberosity.  We then performed a biceps tenodesis with the sutures in the humeral shaft as well, achieving good fixation and the overall construct was much to our satisfaction.  There was excellent motion of the shoulder passively once this was all completed.  All suture limbs were then clipped.  The wound was copiously irrigated.  Hemostasis was obtained.  The deltopectoral interval was then  reapproximated with #1 Vicryl, 2-0 Vicryl used for the subcu layer, and intracuticular 3-0 Monocryl used for the skin followed by Steri- Strips.  Dry dressings were then applied.  Right arm was placed in a sling.  The patient was awakened, extubated, and taken to recovery in stable condition.     Vania Rea. Khyan Oats, M.D.     KMS/MEDQ  D:  02/17/2012  T:  02/18/2012  Job:  [id number]

## 2012-02-18 NOTE — Evaluation (Signed)
Occupational Therapy Evaluation Patient Details Name: Lori [name] MRN: [id number] DOB: August 22, 1934 Today's Date: 02/18/2012 Time: 0813-0907 OT Time Calculation (min): 54 min  OT Assessment / Plan / Recommendation Clinical Impression  77 yo female s/p Right reverse shoulder that does not require skilled OT acutely. All education completed. Pt ready for d/c home with caregiver support    OT Assessment  Patient does not need any further OT services (follow up with Md for progression of OT)    Follow Up Recommendations  No OT follow up (MD to order in 2 weeks)    Barriers to Discharge      Equipment Recommendations  None recommended by OT    Recommendations for Other Services    Frequency       Precautions / Restrictions Precautions Precautions: Shoulder Type of Shoulder Precautions: Supple shoulder protcol: 45 shoulder flexion, 30 abduction, neutral external rotation  Restrictions Weight Bearing Restrictions: Yes RUE Weight Bearing: Non weight bearing   Pertinent Vitals/Pain Minimal and RN Carollee Herter providing schedule medication at beginning of session to (A) with pain managment    ADL  Grooming: Performed;Wash/dry face;Minimal assistance Where Assessed - Grooming: Supported sitting Upper Body Bathing: Performed;Right arm;Maximal assistance Where Assessed - Upper Body Bathing: Unsupported sitting (performed and husband to (A) at home) Upper Body Dressing: Performed;Maximal assistance Where Assessed - Upper Body Dressing: Unsupported sitting (educated on dressing Rt UE first) Lower Body Dressing: Performed;Moderate assistance Where Assessed - Lower Body Dressing: Supported sit to stand Equipment Used:  (blue sling) Transfers/Ambulation Related to ADLs: pt with min (A) due to first time sit<>stand since surg ADL Comments: Pt with all education completed and shoulder handouts provided. Pt and husand demonstrated exercises.    OT Diagnosis:    OT Problem List:   OT  Treatment Interventions:     OT Goals    Visit Information  Last OT Received On: 02/18/12 Assistance Needed: +1    Subjective Data  Subjective: "I have to do my exercises so you can't get made at me like Orpah Clinton" - pt talking to husband. Husband trained as CNA and (A) a quadplegic patient with ROM exercises in addition to wife Patient Stated Goal: to return home today   Prior Functioning  Vision/Perception  Home Living Lives With: Spouse Available Help at Discharge: Family Type of Home: House Home Access: Ramped entrance (4-6 steps in the back ) Home Layout: One level Bathroom Shower/Tub: Health visitor: Handicapped height Home Adaptive Equipment: Quad cane;Grab bars in shower;Hand-held shower hose Prior Function Level of Independence: Independent Able to Take Stairs?: Yes Driving: Yes Vocation: Retired (worked at [employer] ) Musician: No difficulties Dominant Hand: Right      Cognition  Overall Cognitive Status: Appears within functional limits for tasks assessed/performed Arousal/Alertness: Awake/alert Orientation Level: Oriented X4 / Intact Behavior During Session: WFL for tasks performed    Extremity/Trunk Assessment Right Upper Extremity Assessment RUE ROM/Strength/Tone: Deficits Left Upper Extremity Assessment LUE ROM/Strength/Tone: Within functional levels LUE Coordination: WFL - gross motor (fine motor deficits due to arthritis) Trunk Assessment Trunk Assessment: Normal   Mobility  Shoulder Instructions  Bed Mobility Bed Mobility: Rolling Left;Left Sidelying to Sit;Supine to Sit;Sitting - Scoot to Edge of Bed Rolling Left: 4: Min assist;With rail Left Sidelying to Sit: 3: Mod assist;HOB elevated;With rails Supine to Sit: 3: Mod assist;With rails;HOB elevated Sitting - Scoot to Edge of Bed: 4: Min assist Sit to Supine: 3: Mod assist;HOB elevated Details for Bed Mobility Assistance: pt and  husband use bed sheet to (A)  with bed mobility at baseline. Pt will have (A) of husband 24/7 Transfers Transfers: Sit to Stand;Stand to Sit Sit to Stand: 4: Min assist;From bed Stand to Sit: 4: Min assist;To bed   Donning/doffing shirt without moving shoulder: Minimal assistance;Caregiver independent with task Method for sponge bathing under operated UE: Minimal assistance;Caregiver independent with task Donning/doffing sling/immobilizer: Minimal assistance;Caregiver independent with task Correct positioning of sling/immobilizer: Minimal assistance;Caregiver independent with task Pendulum exercises (written home exercise program):  (nA) ROM for elbow, wrist and digits of operated UE: Modified independent Sling wearing schedule (on at all times/off for ADL's): Modified independent Proper positioning of operated UE when showering: Modified independent Positioning of UE while sleeping: Modified independent;Caregiver independent with task   Exercise Shoulder Exercises Shoulder Flexion: Right;10 reps;Supine;AAROM (45 degrees) Shoulder ABduction: AAROM;Right;10 reps;Supine (30 degrees) Shoulder External Rotation: AAROM;Right;10 reps;Supine (neutral position) Elbow Flexion: AROM;Right;10 reps;Seated Elbow Extension: AROM;10 reps;Right;Seated Wrist Flexion: AROM;Right;10 reps;Seated Wrist Extension: AROM;Right;10 reps;Seated Digit Composite Flexion: AROM;Right;10 reps;Seated Composite Extension: AROM;Right;10 reps;Seated   Balance     End of Session OT - End of Session Activity Tolerance: Patient tolerated treatment well Patient left: in bed;with call bell/phone within reach Nurse Communication: Precautions;Mobility status  GO     Harrel Carina [facility] 02/18/2012, 9:32 AM Pager: [phone]

## 2013-07-10 ENCOUNTER — Encounter: Payer: Self-pay | Admitting: Podiatrist

## 2013-07-10 ENCOUNTER — Ambulatory Visit (INDEPENDENT_AMBULATORY_CARE_PROVIDER_SITE_OTHER): Payer: Medicare Other | Admitting: Podiatrist

## 2013-07-10 VITALS — BP 140/88 | HR 89 | Resp 18

## 2013-07-10 DIAGNOSIS — L03039 Cellulitis of unspecified toe: Secondary | ICD-10-CM

## 2013-07-10 NOTE — Patient Instructions (Signed)

## 2013-07-10 NOTE — Progress Notes (Deleted)
   Subjective:    Patient ID: Lori [name], female    DOB: 11/17/1934, 78 y.o.   MRN: [id number]  HPI  Dr Uppin stated that the big toenail on the right foot is ingrown and I used neosporin and the left big toenail has a dark place on the back of the big toe and been going on for about 2 months     Review of Systems  Respiratory: Positive for cough.   All other systems reviewed and are negative.       Objective:   Physical Exam GENERAL APPEARANCE: Alert, conversant. Appropriately groomed. No acute distress.  VASCULAR: Pedal pulses palpable at 2/4 DP and PT bilateral.  Capillary refill time is immediate to all digits,  Proximal to distal cooling is warm to cool.  Digital hair growth is absent bilateral  NEUROLOGIC: sensation is intact epicritically and protectively to 5.07 monofilament at 5/5 sites bilateral.  Light touch is intact bilateral MUSCULOSKELETAL: contracture deformity of digits 2,3,4,5 are noted bilateral.  Patient is a rheumatoid arthritic.   DERMATOLOGIC: medial nail border of the right hallux nail appears to have grown out and the ingrown toenail looks resolved.  No pain with pressure.  There is a notch in the toenail that could have caught on the skin for a time making it uncomfortable.  The left hallux has a callus type lesion on the planter aspect-  It appears to be tender but no open lesion is seen.  The remainder of the toenails look normal.  No mycotic infection of the toenails is present     Assessment & Plan:  Paronychia right hallux nail  Plan:  Discussed with the patient that the ingrown toenail appears to have grown out and has resolved with the use of antibiotics and soaking the she has been doing. I trimmed her bilateral great toenails for her as a courtesy today as she has rheumatoid arthritis and is unable to trim them well herself. she'll be seen back on as-needed basis if any problems arise she is instructed to call. 

## 2013-07-11 NOTE — Progress Notes (Signed)
   Subjective:    Patient ID: Lori [name], female    DOB: Jan 13, 1935, 79 y.o.   MRN: [id number]  HPI  Dr Mathis Bud stated that the big toenail on the right foot is ingrown and I used neosporin and the left big toenail has a dark place on the back of the big toe and been going on for about 2 months     Review of Systems  Respiratory: Positive for cough.   All other systems reviewed and are negative.       Objective:   Physical Exam GENERAL APPEARANCE: Alert, conversant. Appropriately groomed. No acute distress.  VASCULAR: Pedal pulses palpable at 2/4 DP and PT bilateral.  Capillary refill time is immediate to all digits,  Proximal to distal cooling is warm to cool.  Digital hair growth is absent bilateral  NEUROLOGIC: sensation is intact epicritically and protectively to 5.07 monofilament at 5/5 sites bilateral.  Light touch is intact bilateral MUSCULOSKELETAL: contracture deformity of digits 2,3,4,5 are noted bilateral.  Patient is a rheumatoid arthritic.   DERMATOLOGIC: medial nail border of the right hallux nail appears to have grown out and the ingrown toenail looks resolved.  No pain with pressure.  There is a notch in the toenail that could have caught on the skin for a time making it uncomfortable.  The left hallux has a callus type lesion on the planter aspect-  It appears to be tender but no open lesion is seen.  The remainder of the toenails look normal.  No mycotic infection of the toenails is present     Assessment & Plan:  Paronychia right hallux nail  Plan:  Discussed with the patient that the ingrown toenail appears to have grown out and has resolved with the use of antibiotics and soaking the she has been doing. I trimmed her bilateral great toenails for her as a courtesy today as she has rheumatoid arthritis and is unable to trim them well herself. she'll be seen back on as-needed basis if any problems arise she is instructed to call.

## 2013-10-08 IMAGING — CR DG CHEST 2V
2 series · 2 of 2 positions shown · non-contrast
Comparison: None.

CLINICAL DATA: Preoperative examination (shoulder/arm surgery)

CHEST - 2 VIEW

[view not recorded (1 of 2)]
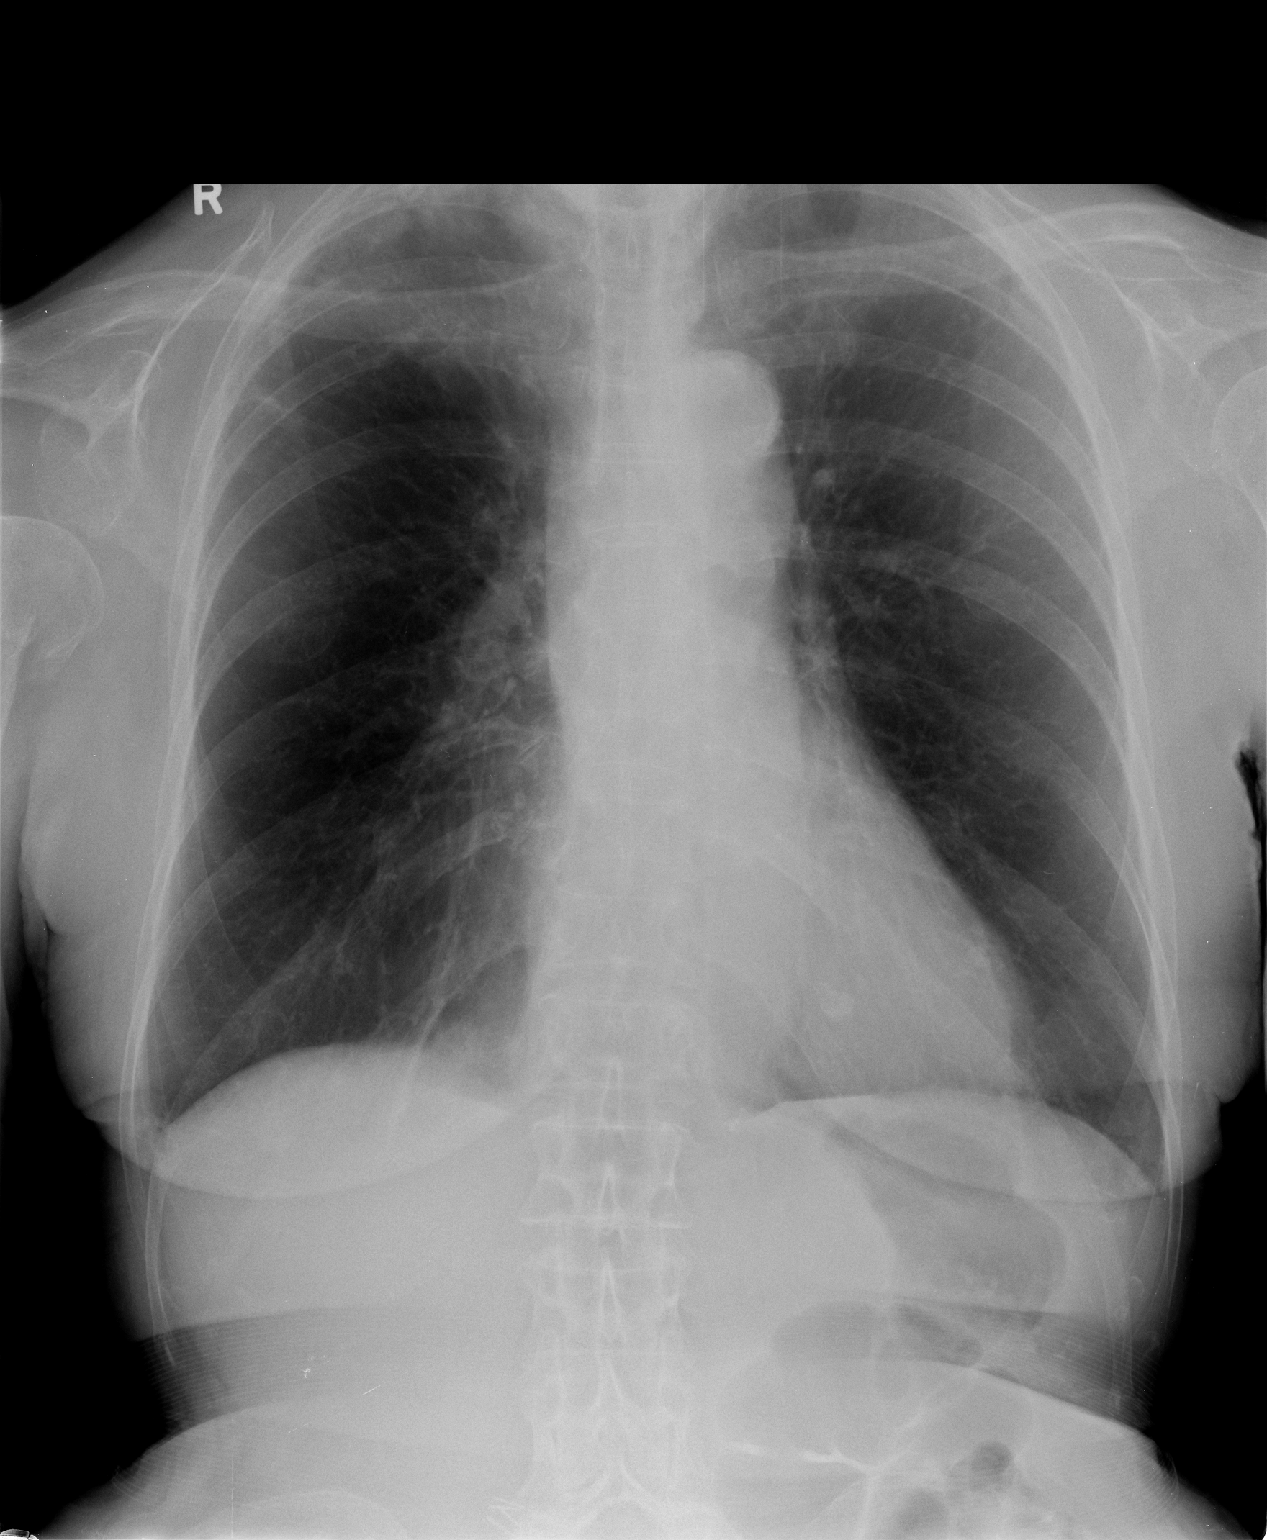

[view not recorded (2 of 2)]
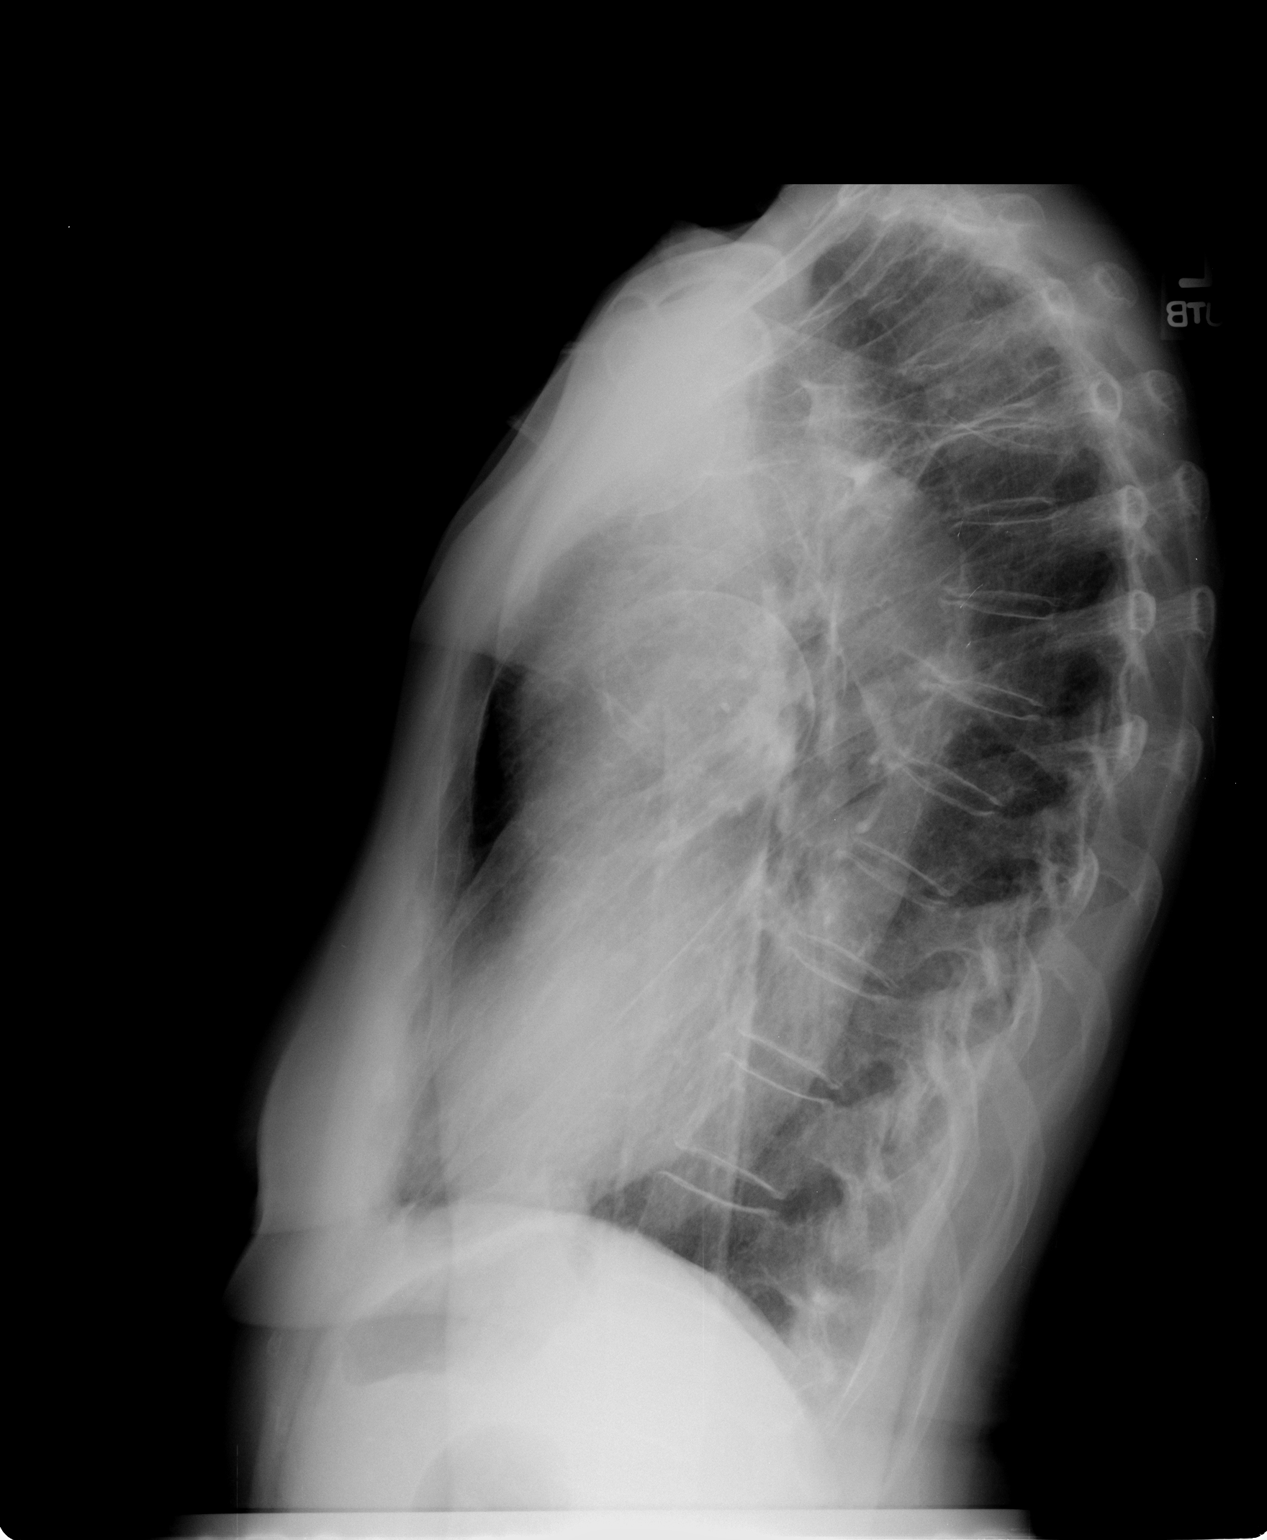

[2 of 2 positions shown; findings below may reference images not displayed]

FINDINGS: Normal cardiac silhouette and mediastinal contours.  The
lungs are hyperexpanded with flattening of bilateral
hemidiaphragms.  Slightly asymmetric biapical pleural parenchymal
thickening, right greater than left.  Linear heterogeneous
opacities in the peripheral aspect the right upper lung are favored
to represent subsegmental atelectasis or scar. No focal airspace
opacities.  No pleural effusion or pneumothorax.

A dystrophic calcification is seen within the inferior medial
aspect the left breast.

There is inferior dislocation of the humeral head in relation to
the glenoid.  Minimally displaced and slightly impacted fracture of
the surgical neck of the humerus, incompletely evaluated.
IMPRESSION: 1.  No acute cardiopulmonary disease.
2.  Hyperexpanded lungs with slightly asymmetric biapical pleural
parenchymal thickening, right greater than left.
3.  Minimally displaced impacted fracture of the surgical neck of
the humerus with inferior dislocation of the humeral head in
relation to the glenoid, incompletely evaluated.

## 2014-08-28 DIAGNOSIS — Z9181 History of falling: Secondary | ICD-10-CM | POA: Diagnosis not present

## 2014-08-28 DIAGNOSIS — E782 Mixed hyperlipidemia: Secondary | ICD-10-CM | POA: Diagnosis not present

## 2014-08-28 DIAGNOSIS — M069 Rheumatoid arthritis, unspecified: Secondary | ICD-10-CM | POA: Diagnosis not present

## 2014-08-28 DIAGNOSIS — J309 Allergic rhinitis, unspecified: Secondary | ICD-10-CM | POA: Diagnosis not present

## 2014-08-28 DIAGNOSIS — Z79899 Other long term (current) drug therapy: Secondary | ICD-10-CM | POA: Diagnosis not present

## 2014-08-28 DIAGNOSIS — I10 Essential (primary) hypertension: Secondary | ICD-10-CM | POA: Diagnosis not present

## 2014-09-09 DIAGNOSIS — Z471 Aftercare following joint replacement surgery: Secondary | ICD-10-CM | POA: Diagnosis not present

## 2014-09-09 DIAGNOSIS — Z96611 Presence of right artificial shoulder joint: Secondary | ICD-10-CM | POA: Diagnosis not present

## 2014-09-09 DIAGNOSIS — S42291D Other displaced fracture of upper end of right humerus, subsequent encounter for fracture with routine healing: Secondary | ICD-10-CM | POA: Diagnosis not present

## 2014-09-26 DIAGNOSIS — R300 Dysuria: Secondary | ICD-10-CM | POA: Diagnosis not present

## 2014-09-26 DIAGNOSIS — Z1212 Encounter for screening for malignant neoplasm of rectum: Secondary | ICD-10-CM | POA: Diagnosis not present

## 2014-09-26 DIAGNOSIS — Z1231 Encounter for screening mammogram for malignant neoplasm of breast: Secondary | ICD-10-CM | POA: Diagnosis not present

## 2014-09-26 DIAGNOSIS — Z0000 Encounter for general adult medical examination without abnormal findings: Secondary | ICD-10-CM | POA: Diagnosis not present

## 2014-09-26 DIAGNOSIS — I10 Essential (primary) hypertension: Secondary | ICD-10-CM | POA: Diagnosis not present

## 2014-09-26 DIAGNOSIS — Z79899 Other long term (current) drug therapy: Secondary | ICD-10-CM | POA: Diagnosis not present

## 2014-10-02 DIAGNOSIS — M0589 Other rheumatoid arthritis with rheumatoid factor of multiple sites: Secondary | ICD-10-CM | POA: Diagnosis not present

## 2014-10-02 DIAGNOSIS — M150 Primary generalized (osteo)arthritis: Secondary | ICD-10-CM | POA: Diagnosis not present

## 2014-10-04 DIAGNOSIS — M810 Age-related osteoporosis without current pathological fracture: Secondary | ICD-10-CM | POA: Diagnosis not present

## 2014-10-04 DIAGNOSIS — Z1231 Encounter for screening mammogram for malignant neoplasm of breast: Secondary | ICD-10-CM | POA: Diagnosis not present

## 2014-10-04 DIAGNOSIS — M8589 Other specified disorders of bone density and structure, multiple sites: Secondary | ICD-10-CM | POA: Diagnosis not present

## 2014-12-26 DIAGNOSIS — I10 Essential (primary) hypertension: Secondary | ICD-10-CM | POA: Diagnosis not present

## 2014-12-26 DIAGNOSIS — M810 Age-related osteoporosis without current pathological fracture: Secondary | ICD-10-CM | POA: Diagnosis not present

## 2014-12-26 DIAGNOSIS — E782 Mixed hyperlipidemia: Secondary | ICD-10-CM | POA: Diagnosis not present

## 2014-12-26 DIAGNOSIS — M069 Rheumatoid arthritis, unspecified: Secondary | ICD-10-CM | POA: Diagnosis not present

## 2014-12-26 DIAGNOSIS — E559 Vitamin D deficiency, unspecified: Secondary | ICD-10-CM | POA: Diagnosis not present

## 2014-12-26 DIAGNOSIS — Z79899 Other long term (current) drug therapy: Secondary | ICD-10-CM | POA: Diagnosis not present

## 2015-03-24 DIAGNOSIS — J309 Allergic rhinitis, unspecified: Secondary | ICD-10-CM | POA: Diagnosis not present

## 2015-03-24 DIAGNOSIS — M069 Rheumatoid arthritis, unspecified: Secondary | ICD-10-CM | POA: Diagnosis not present

## 2015-03-24 DIAGNOSIS — E782 Mixed hyperlipidemia: Secondary | ICD-10-CM | POA: Diagnosis not present

## 2015-03-24 DIAGNOSIS — I10 Essential (primary) hypertension: Secondary | ICD-10-CM | POA: Diagnosis not present

## 2015-03-24 DIAGNOSIS — Z79899 Other long term (current) drug therapy: Secondary | ICD-10-CM | POA: Diagnosis not present

## 2015-04-07 DIAGNOSIS — M0589 Other rheumatoid arthritis with rheumatoid factor of multiple sites: Secondary | ICD-10-CM | POA: Diagnosis not present

## 2015-04-07 DIAGNOSIS — M25561 Pain in right knee: Secondary | ICD-10-CM | POA: Diagnosis not present

## 2015-04-07 DIAGNOSIS — M150 Primary generalized (osteo)arthritis: Secondary | ICD-10-CM | POA: Diagnosis not present

## 2015-04-07 DIAGNOSIS — Z79899 Other long term (current) drug therapy: Secondary | ICD-10-CM | POA: Diagnosis not present

## 2015-04-14 DIAGNOSIS — Z23 Encounter for immunization: Secondary | ICD-10-CM | POA: Diagnosis not present

## 2015-06-25 DIAGNOSIS — Z79899 Other long term (current) drug therapy: Secondary | ICD-10-CM | POA: Diagnosis not present

## 2015-06-25 DIAGNOSIS — E559 Vitamin D deficiency, unspecified: Secondary | ICD-10-CM | POA: Diagnosis not present

## 2015-06-25 DIAGNOSIS — I10 Essential (primary) hypertension: Secondary | ICD-10-CM | POA: Diagnosis not present

## 2015-06-25 DIAGNOSIS — Z1389 Encounter for screening for other disorder: Secondary | ICD-10-CM | POA: Diagnosis not present

## 2015-06-25 DIAGNOSIS — J309 Allergic rhinitis, unspecified: Secondary | ICD-10-CM | POA: Diagnosis not present

## 2015-06-25 DIAGNOSIS — E782 Mixed hyperlipidemia: Secondary | ICD-10-CM | POA: Diagnosis not present

## 2015-06-26 DIAGNOSIS — H26493 Other secondary cataract, bilateral: Secondary | ICD-10-CM | POA: Diagnosis not present

## 2015-06-26 DIAGNOSIS — H04123 Dry eye syndrome of bilateral lacrimal glands: Secondary | ICD-10-CM | POA: Diagnosis not present

## 2015-07-25 DIAGNOSIS — Z1211 Encounter for screening for malignant neoplasm of colon: Secondary | ICD-10-CM | POA: Diagnosis not present

## 2015-08-03 DIAGNOSIS — R0789 Other chest pain: Secondary | ICD-10-CM | POA: Diagnosis not present

## 2015-08-03 DIAGNOSIS — R079 Chest pain, unspecified: Secondary | ICD-10-CM | POA: Diagnosis not present

## 2015-08-03 DIAGNOSIS — R0602 Shortness of breath: Secondary | ICD-10-CM | POA: Diagnosis not present

## 2015-08-03 DIAGNOSIS — Z7982 Long term (current) use of aspirin: Secondary | ICD-10-CM | POA: Diagnosis not present

## 2015-08-03 DIAGNOSIS — Z7952 Long term (current) use of systemic steroids: Secondary | ICD-10-CM | POA: Diagnosis not present

## 2015-08-03 DIAGNOSIS — I712 Thoracic aortic aneurysm, without rupture: Secondary | ICD-10-CM | POA: Diagnosis not present

## 2015-08-03 DIAGNOSIS — Z79899 Other long term (current) drug therapy: Secondary | ICD-10-CM | POA: Diagnosis not present

## 2015-08-03 DIAGNOSIS — I10 Essential (primary) hypertension: Secondary | ICD-10-CM | POA: Diagnosis not present

## 2015-08-03 DIAGNOSIS — M069 Rheumatoid arthritis, unspecified: Secondary | ICD-10-CM | POA: Diagnosis not present

## 2015-08-09 DIAGNOSIS — R079 Chest pain, unspecified: Secondary | ICD-10-CM | POA: Diagnosis not present

## 2015-08-09 DIAGNOSIS — R0789 Other chest pain: Secondary | ICD-10-CM | POA: Diagnosis not present

## 2015-08-18 DIAGNOSIS — Z09 Encounter for follow-up examination after completed treatment for conditions other than malignant neoplasm: Secondary | ICD-10-CM | POA: Diagnosis not present

## 2015-08-18 DIAGNOSIS — Z9181 History of falling: Secondary | ICD-10-CM | POA: Diagnosis not present

## 2015-08-18 DIAGNOSIS — I712 Thoracic aortic aneurysm, without rupture: Secondary | ICD-10-CM | POA: Diagnosis not present

## 2015-08-27 DIAGNOSIS — E785 Hyperlipidemia, unspecified: Secondary | ICD-10-CM | POA: Diagnosis not present

## 2015-08-27 DIAGNOSIS — Z7982 Long term (current) use of aspirin: Secondary | ICD-10-CM | POA: Diagnosis not present

## 2015-08-27 DIAGNOSIS — K219 Gastro-esophageal reflux disease without esophagitis: Secondary | ICD-10-CM | POA: Diagnosis not present

## 2015-08-27 DIAGNOSIS — G5600 Carpal tunnel syndrome, unspecified upper limb: Secondary | ICD-10-CM | POA: Diagnosis not present

## 2015-08-27 DIAGNOSIS — M810 Age-related osteoporosis without current pathological fracture: Secondary | ICD-10-CM | POA: Diagnosis not present

## 2015-08-27 DIAGNOSIS — I712 Thoracic aortic aneurysm, without rupture: Secondary | ICD-10-CM | POA: Diagnosis not present

## 2015-08-27 DIAGNOSIS — I10 Essential (primary) hypertension: Secondary | ICD-10-CM | POA: Diagnosis not present

## 2015-08-27 DIAGNOSIS — M069 Rheumatoid arthritis, unspecified: Secondary | ICD-10-CM | POA: Diagnosis not present

## 2015-08-27 DIAGNOSIS — E559 Vitamin D deficiency, unspecified: Secondary | ICD-10-CM | POA: Diagnosis not present

## 2015-09-11 DIAGNOSIS — S2239XA Fracture of one rib, unspecified side, initial encounter for closed fracture: Secondary | ICD-10-CM | POA: Diagnosis not present

## 2015-09-11 DIAGNOSIS — Z79899 Other long term (current) drug therapy: Secondary | ICD-10-CM | POA: Diagnosis not present

## 2015-09-11 DIAGNOSIS — Z7982 Long term (current) use of aspirin: Secondary | ICD-10-CM | POA: Diagnosis not present

## 2015-09-11 DIAGNOSIS — E860 Dehydration: Secondary | ICD-10-CM | POA: Diagnosis not present

## 2015-09-11 DIAGNOSIS — N200 Calculus of kidney: Secondary | ICD-10-CM | POA: Diagnosis not present

## 2015-09-11 DIAGNOSIS — R109 Unspecified abdominal pain: Secondary | ICD-10-CM | POA: Diagnosis not present

## 2015-09-11 DIAGNOSIS — E876 Hypokalemia: Secondary | ICD-10-CM | POA: Diagnosis not present

## 2015-09-11 DIAGNOSIS — R0902 Hypoxemia: Secondary | ICD-10-CM | POA: Diagnosis not present

## 2015-09-11 DIAGNOSIS — G9340 Encephalopathy, unspecified: Secondary | ICD-10-CM | POA: Diagnosis not present

## 2015-09-11 DIAGNOSIS — R0602 Shortness of breath: Secondary | ICD-10-CM | POA: Diagnosis not present

## 2015-09-11 DIAGNOSIS — Z681 Body mass index (BMI) 19 or less, adult: Secondary | ICD-10-CM | POA: Diagnosis not present

## 2015-09-11 DIAGNOSIS — K5900 Constipation, unspecified: Secondary | ICD-10-CM | POA: Diagnosis not present

## 2015-09-11 DIAGNOSIS — R4702 Dysphasia: Secondary | ICD-10-CM | POA: Diagnosis not present

## 2015-09-11 DIAGNOSIS — T50995A Adverse effect of other drugs, medicaments and biological substances, initial encounter: Secondary | ICD-10-CM | POA: Diagnosis not present

## 2015-09-11 DIAGNOSIS — I10 Essential (primary) hypertension: Secondary | ICD-10-CM | POA: Diagnosis not present

## 2015-09-11 DIAGNOSIS — R4182 Altered mental status, unspecified: Secondary | ICD-10-CM | POA: Diagnosis not present

## 2015-09-11 DIAGNOSIS — R5381 Other malaise: Secondary | ICD-10-CM | POA: Diagnosis not present

## 2015-09-11 DIAGNOSIS — M069 Rheumatoid arthritis, unspecified: Secondary | ICD-10-CM | POA: Diagnosis not present

## 2015-09-15 DIAGNOSIS — K5900 Constipation, unspecified: Secondary | ICD-10-CM | POA: Diagnosis not present

## 2015-09-15 DIAGNOSIS — I10 Essential (primary) hypertension: Secondary | ICD-10-CM | POA: Diagnosis not present

## 2015-09-15 DIAGNOSIS — E559 Vitamin D deficiency, unspecified: Secondary | ICD-10-CM | POA: Diagnosis not present

## 2015-09-15 DIAGNOSIS — I119 Hypertensive heart disease without heart failure: Secondary | ICD-10-CM | POA: Diagnosis not present

## 2015-09-15 DIAGNOSIS — Z79899 Other long term (current) drug therapy: Secondary | ICD-10-CM | POA: Diagnosis not present

## 2015-09-15 DIAGNOSIS — I739 Peripheral vascular disease, unspecified: Secondary | ICD-10-CM | POA: Diagnosis not present

## 2015-09-15 DIAGNOSIS — R262 Difficulty in walking, not elsewhere classified: Secondary | ICD-10-CM | POA: Diagnosis not present

## 2015-09-15 DIAGNOSIS — E876 Hypokalemia: Secondary | ICD-10-CM | POA: Diagnosis not present

## 2015-09-15 DIAGNOSIS — G9340 Encephalopathy, unspecified: Secondary | ICD-10-CM | POA: Diagnosis not present

## 2015-09-23 DIAGNOSIS — I739 Peripheral vascular disease, unspecified: Secondary | ICD-10-CM | POA: Diagnosis not present

## 2015-09-23 DIAGNOSIS — R262 Difficulty in walking, not elsewhere classified: Secondary | ICD-10-CM | POA: Diagnosis not present

## 2015-09-23 DIAGNOSIS — I119 Hypertensive heart disease without heart failure: Secondary | ICD-10-CM | POA: Diagnosis not present

## 2015-10-03 DIAGNOSIS — Z7982 Long term (current) use of aspirin: Secondary | ICD-10-CM | POA: Diagnosis not present

## 2015-10-03 DIAGNOSIS — I712 Thoracic aortic aneurysm, without rupture: Secondary | ICD-10-CM | POA: Diagnosis not present

## 2015-10-03 DIAGNOSIS — Z9181 History of falling: Secondary | ICD-10-CM | POA: Diagnosis not present

## 2015-10-03 DIAGNOSIS — K5900 Constipation, unspecified: Secondary | ICD-10-CM | POA: Diagnosis not present

## 2015-10-03 DIAGNOSIS — R5381 Other malaise: Secondary | ICD-10-CM | POA: Diagnosis not present

## 2015-10-03 DIAGNOSIS — Z9081 Acquired absence of spleen: Secondary | ICD-10-CM | POA: Diagnosis not present

## 2015-10-03 DIAGNOSIS — M069 Rheumatoid arthritis, unspecified: Secondary | ICD-10-CM | POA: Diagnosis not present

## 2015-10-03 DIAGNOSIS — S2239XD Fracture of one rib, unspecified side, subsequent encounter for fracture with routine healing: Secondary | ICD-10-CM | POA: Diagnosis not present

## 2015-10-03 DIAGNOSIS — I10 Essential (primary) hypertension: Secondary | ICD-10-CM | POA: Diagnosis not present

## 2015-10-06 DIAGNOSIS — M150 Primary generalized (osteo)arthritis: Secondary | ICD-10-CM | POA: Diagnosis not present

## 2015-10-06 DIAGNOSIS — M0589 Other rheumatoid arthritis with rheumatoid factor of multiple sites: Secondary | ICD-10-CM | POA: Diagnosis not present

## 2015-10-06 DIAGNOSIS — M810 Age-related osteoporosis without current pathological fracture: Secondary | ICD-10-CM | POA: Diagnosis not present

## 2015-10-06 DIAGNOSIS — M25569 Pain in unspecified knee: Secondary | ICD-10-CM | POA: Diagnosis not present

## 2015-10-07 DIAGNOSIS — I712 Thoracic aortic aneurysm, without rupture: Secondary | ICD-10-CM | POA: Diagnosis not present

## 2015-10-07 DIAGNOSIS — Z7982 Long term (current) use of aspirin: Secondary | ICD-10-CM | POA: Diagnosis not present

## 2015-10-07 DIAGNOSIS — Z9181 History of falling: Secondary | ICD-10-CM | POA: Diagnosis not present

## 2015-10-07 DIAGNOSIS — I10 Essential (primary) hypertension: Secondary | ICD-10-CM | POA: Diagnosis not present

## 2015-10-07 DIAGNOSIS — K5900 Constipation, unspecified: Secondary | ICD-10-CM | POA: Diagnosis not present

## 2015-10-07 DIAGNOSIS — M069 Rheumatoid arthritis, unspecified: Secondary | ICD-10-CM | POA: Diagnosis not present

## 2015-10-07 DIAGNOSIS — R5381 Other malaise: Secondary | ICD-10-CM | POA: Diagnosis not present

## 2015-10-07 DIAGNOSIS — Z9081 Acquired absence of spleen: Secondary | ICD-10-CM | POA: Diagnosis not present

## 2015-10-07 DIAGNOSIS — S2239XD Fracture of one rib, unspecified side, subsequent encounter for fracture with routine healing: Secondary | ICD-10-CM | POA: Diagnosis not present

## 2015-10-09 DIAGNOSIS — I10 Essential (primary) hypertension: Secondary | ICD-10-CM | POA: Diagnosis not present

## 2015-10-09 DIAGNOSIS — M069 Rheumatoid arthritis, unspecified: Secondary | ICD-10-CM | POA: Diagnosis not present

## 2015-10-09 DIAGNOSIS — S2239XD Fracture of one rib, unspecified side, subsequent encounter for fracture with routine healing: Secondary | ICD-10-CM | POA: Diagnosis not present

## 2015-10-09 DIAGNOSIS — I712 Thoracic aortic aneurysm, without rupture: Secondary | ICD-10-CM | POA: Diagnosis not present

## 2015-10-09 DIAGNOSIS — Z9181 History of falling: Secondary | ICD-10-CM | POA: Diagnosis not present

## 2015-10-09 DIAGNOSIS — K5900 Constipation, unspecified: Secondary | ICD-10-CM | POA: Diagnosis not present

## 2015-10-09 DIAGNOSIS — Z7982 Long term (current) use of aspirin: Secondary | ICD-10-CM | POA: Diagnosis not present

## 2015-10-09 DIAGNOSIS — Z9081 Acquired absence of spleen: Secondary | ICD-10-CM | POA: Diagnosis not present

## 2015-10-09 DIAGNOSIS — R5381 Other malaise: Secondary | ICD-10-CM | POA: Diagnosis not present

## 2015-10-13 DIAGNOSIS — M069 Rheumatoid arthritis, unspecified: Secondary | ICD-10-CM | POA: Diagnosis not present

## 2015-10-13 DIAGNOSIS — Z9081 Acquired absence of spleen: Secondary | ICD-10-CM | POA: Diagnosis not present

## 2015-10-13 DIAGNOSIS — K5900 Constipation, unspecified: Secondary | ICD-10-CM | POA: Diagnosis not present

## 2015-10-13 DIAGNOSIS — I712 Thoracic aortic aneurysm, without rupture: Secondary | ICD-10-CM | POA: Diagnosis not present

## 2015-10-13 DIAGNOSIS — E782 Mixed hyperlipidemia: Secondary | ICD-10-CM | POA: Diagnosis not present

## 2015-10-13 DIAGNOSIS — R5381 Other malaise: Secondary | ICD-10-CM | POA: Diagnosis not present

## 2015-10-13 DIAGNOSIS — S2239XD Fracture of one rib, unspecified side, subsequent encounter for fracture with routine healing: Secondary | ICD-10-CM | POA: Diagnosis not present

## 2015-10-13 DIAGNOSIS — E876 Hypokalemia: Secondary | ICD-10-CM | POA: Diagnosis not present

## 2015-10-13 DIAGNOSIS — I10 Essential (primary) hypertension: Secondary | ICD-10-CM | POA: Diagnosis not present

## 2015-10-13 DIAGNOSIS — Z9181 History of falling: Secondary | ICD-10-CM | POA: Diagnosis not present

## 2015-10-13 DIAGNOSIS — Z7982 Long term (current) use of aspirin: Secondary | ICD-10-CM | POA: Diagnosis not present

## 2015-10-16 DIAGNOSIS — K5900 Constipation, unspecified: Secondary | ICD-10-CM | POA: Diagnosis not present

## 2015-10-16 DIAGNOSIS — M069 Rheumatoid arthritis, unspecified: Secondary | ICD-10-CM | POA: Diagnosis not present

## 2015-10-16 DIAGNOSIS — Z9181 History of falling: Secondary | ICD-10-CM | POA: Diagnosis not present

## 2015-10-16 DIAGNOSIS — I712 Thoracic aortic aneurysm, without rupture: Secondary | ICD-10-CM | POA: Diagnosis not present

## 2015-10-16 DIAGNOSIS — I10 Essential (primary) hypertension: Secondary | ICD-10-CM | POA: Diagnosis not present

## 2015-10-16 DIAGNOSIS — Z9081 Acquired absence of spleen: Secondary | ICD-10-CM | POA: Diagnosis not present

## 2015-10-16 DIAGNOSIS — Z7982 Long term (current) use of aspirin: Secondary | ICD-10-CM | POA: Diagnosis not present

## 2015-10-16 DIAGNOSIS — R5381 Other malaise: Secondary | ICD-10-CM | POA: Diagnosis not present

## 2015-10-16 DIAGNOSIS — S2239XD Fracture of one rib, unspecified side, subsequent encounter for fracture with routine healing: Secondary | ICD-10-CM | POA: Diagnosis not present

## 2015-10-20 DIAGNOSIS — I10 Essential (primary) hypertension: Secondary | ICD-10-CM | POA: Diagnosis not present

## 2015-10-20 DIAGNOSIS — M069 Rheumatoid arthritis, unspecified: Secondary | ICD-10-CM | POA: Diagnosis not present

## 2015-10-20 DIAGNOSIS — Z7982 Long term (current) use of aspirin: Secondary | ICD-10-CM | POA: Diagnosis not present

## 2015-10-20 DIAGNOSIS — S2239XD Fracture of one rib, unspecified side, subsequent encounter for fracture with routine healing: Secondary | ICD-10-CM | POA: Diagnosis not present

## 2015-10-20 DIAGNOSIS — K5900 Constipation, unspecified: Secondary | ICD-10-CM | POA: Diagnosis not present

## 2015-10-20 DIAGNOSIS — Z9081 Acquired absence of spleen: Secondary | ICD-10-CM | POA: Diagnosis not present

## 2015-10-20 DIAGNOSIS — R5381 Other malaise: Secondary | ICD-10-CM | POA: Diagnosis not present

## 2015-10-20 DIAGNOSIS — Z9181 History of falling: Secondary | ICD-10-CM | POA: Diagnosis not present

## 2015-10-20 DIAGNOSIS — I712 Thoracic aortic aneurysm, without rupture: Secondary | ICD-10-CM | POA: Diagnosis not present

## 2015-10-23 DIAGNOSIS — K5900 Constipation, unspecified: Secondary | ICD-10-CM | POA: Diagnosis not present

## 2015-10-23 DIAGNOSIS — Z7982 Long term (current) use of aspirin: Secondary | ICD-10-CM | POA: Diagnosis not present

## 2015-10-23 DIAGNOSIS — I712 Thoracic aortic aneurysm, without rupture: Secondary | ICD-10-CM | POA: Diagnosis not present

## 2015-10-23 DIAGNOSIS — S2239XD Fracture of one rib, unspecified side, subsequent encounter for fracture with routine healing: Secondary | ICD-10-CM | POA: Diagnosis not present

## 2015-10-23 DIAGNOSIS — R5381 Other malaise: Secondary | ICD-10-CM | POA: Diagnosis not present

## 2015-10-23 DIAGNOSIS — Z9081 Acquired absence of spleen: Secondary | ICD-10-CM | POA: Diagnosis not present

## 2015-10-23 DIAGNOSIS — I10 Essential (primary) hypertension: Secondary | ICD-10-CM | POA: Diagnosis not present

## 2015-10-23 DIAGNOSIS — M069 Rheumatoid arthritis, unspecified: Secondary | ICD-10-CM | POA: Diagnosis not present

## 2015-10-23 DIAGNOSIS — Z9181 History of falling: Secondary | ICD-10-CM | POA: Diagnosis not present

## 2015-10-29 DIAGNOSIS — R0602 Shortness of breath: Secondary | ICD-10-CM | POA: Diagnosis not present

## 2015-10-29 DIAGNOSIS — I712 Thoracic aortic aneurysm, without rupture: Secondary | ICD-10-CM | POA: Diagnosis not present

## 2015-11-25 DIAGNOSIS — Z0000 Encounter for general adult medical examination without abnormal findings: Secondary | ICD-10-CM | POA: Diagnosis not present

## 2015-11-25 DIAGNOSIS — E559 Vitamin D deficiency, unspecified: Secondary | ICD-10-CM | POA: Diagnosis not present

## 2015-11-25 DIAGNOSIS — J309 Allergic rhinitis, unspecified: Secondary | ICD-10-CM | POA: Diagnosis not present

## 2015-11-25 DIAGNOSIS — I10 Essential (primary) hypertension: Secondary | ICD-10-CM | POA: Diagnosis not present

## 2015-11-25 DIAGNOSIS — M069 Rheumatoid arthritis, unspecified: Secondary | ICD-10-CM | POA: Diagnosis not present

## 2015-12-09 DIAGNOSIS — E559 Vitamin D deficiency, unspecified: Secondary | ICD-10-CM | POA: Diagnosis not present

## 2015-12-09 DIAGNOSIS — R1032 Left lower quadrant pain: Secondary | ICD-10-CM | POA: Diagnosis not present

## 2015-12-09 DIAGNOSIS — R0789 Other chest pain: Secondary | ICD-10-CM | POA: Diagnosis not present

## 2015-12-09 DIAGNOSIS — I10 Essential (primary) hypertension: Secondary | ICD-10-CM | POA: Diagnosis not present

## 2015-12-09 DIAGNOSIS — M549 Dorsalgia, unspecified: Secondary | ICD-10-CM | POA: Diagnosis not present

## 2015-12-09 DIAGNOSIS — Z79899 Other long term (current) drug therapy: Secondary | ICD-10-CM | POA: Diagnosis not present

## 2015-12-09 DIAGNOSIS — M069 Rheumatoid arthritis, unspecified: Secondary | ICD-10-CM | POA: Diagnosis not present

## 2016-01-06 DIAGNOSIS — M810 Age-related osteoporosis without current pathological fracture: Secondary | ICD-10-CM | POA: Diagnosis not present

## 2016-01-06 DIAGNOSIS — M25569 Pain in unspecified knee: Secondary | ICD-10-CM | POA: Diagnosis not present

## 2016-01-06 DIAGNOSIS — M0589 Other rheumatoid arthritis with rheumatoid factor of multiple sites: Secondary | ICD-10-CM | POA: Diagnosis not present

## 2016-01-06 DIAGNOSIS — M150 Primary generalized (osteo)arthritis: Secondary | ICD-10-CM | POA: Diagnosis not present

## 2016-03-15 DIAGNOSIS — Z79899 Other long term (current) drug therapy: Secondary | ICD-10-CM | POA: Diagnosis not present

## 2016-03-15 DIAGNOSIS — R0789 Other chest pain: Secondary | ICD-10-CM | POA: Diagnosis not present

## 2016-03-15 DIAGNOSIS — I712 Thoracic aortic aneurysm, without rupture: Secondary | ICD-10-CM | POA: Diagnosis not present

## 2016-03-15 DIAGNOSIS — I10 Essential (primary) hypertension: Secondary | ICD-10-CM | POA: Diagnosis not present

## 2016-03-15 DIAGNOSIS — J45909 Unspecified asthma, uncomplicated: Secondary | ICD-10-CM | POA: Diagnosis not present

## 2016-03-15 DIAGNOSIS — Z23 Encounter for immunization: Secondary | ICD-10-CM | POA: Diagnosis not present

## 2016-07-01 DIAGNOSIS — E782 Mixed hyperlipidemia: Secondary | ICD-10-CM | POA: Diagnosis not present

## 2016-07-01 DIAGNOSIS — H26493 Other secondary cataract, bilateral: Secondary | ICD-10-CM | POA: Diagnosis not present

## 2016-07-01 DIAGNOSIS — J309 Allergic rhinitis, unspecified: Secondary | ICD-10-CM | POA: Diagnosis not present

## 2016-07-01 DIAGNOSIS — I712 Thoracic aortic aneurysm, without rupture: Secondary | ICD-10-CM | POA: Diagnosis not present

## 2016-07-01 DIAGNOSIS — I10 Essential (primary) hypertension: Secondary | ICD-10-CM | POA: Diagnosis not present

## 2016-07-01 DIAGNOSIS — H6121 Impacted cerumen, right ear: Secondary | ICD-10-CM | POA: Diagnosis not present

## 2016-07-02 DIAGNOSIS — E559 Vitamin D deficiency, unspecified: Secondary | ICD-10-CM | POA: Diagnosis not present

## 2016-07-02 DIAGNOSIS — Z79899 Other long term (current) drug therapy: Secondary | ICD-10-CM | POA: Diagnosis not present

## 2016-07-02 DIAGNOSIS — E782 Mixed hyperlipidemia: Secondary | ICD-10-CM | POA: Diagnosis not present

## 2016-07-08 DIAGNOSIS — M150 Primary generalized (osteo)arthritis: Secondary | ICD-10-CM | POA: Diagnosis not present

## 2016-07-08 DIAGNOSIS — M0589 Other rheumatoid arthritis with rheumatoid factor of multiple sites: Secondary | ICD-10-CM | POA: Diagnosis not present

## 2016-07-08 DIAGNOSIS — M810 Age-related osteoporosis without current pathological fracture: Secondary | ICD-10-CM | POA: Diagnosis not present

## 2016-07-08 DIAGNOSIS — M25569 Pain in unspecified knee: Secondary | ICD-10-CM | POA: Diagnosis not present

## 2016-07-20 DIAGNOSIS — K219 Gastro-esophageal reflux disease without esophagitis: Secondary | ICD-10-CM | POA: Diagnosis not present

## 2016-07-23 DIAGNOSIS — J9811 Atelectasis: Secondary | ICD-10-CM | POA: Diagnosis not present

## 2016-07-23 DIAGNOSIS — D62 Acute posthemorrhagic anemia: Secondary | ICD-10-CM | POA: Diagnosis not present

## 2016-07-23 DIAGNOSIS — J9600 Acute respiratory failure, unspecified whether with hypoxia or hypercapnia: Secondary | ICD-10-CM | POA: Diagnosis not present

## 2016-07-23 DIAGNOSIS — Z8739 Personal history of other diseases of the musculoskeletal system and connective tissue: Secondary | ICD-10-CM | POA: Diagnosis not present

## 2016-07-23 DIAGNOSIS — Z951 Presence of aortocoronary bypass graft: Secondary | ICD-10-CM | POA: Diagnosis not present

## 2016-07-23 DIAGNOSIS — G9349 Other encephalopathy: Secondary | ICD-10-CM | POA: Diagnosis not present

## 2016-07-23 DIAGNOSIS — I10 Essential (primary) hypertension: Secondary | ICD-10-CM | POA: Diagnosis not present

## 2016-07-23 DIAGNOSIS — I959 Hypotension, unspecified: Secondary | ICD-10-CM | POA: Diagnosis not present

## 2016-07-23 DIAGNOSIS — J95821 Acute postprocedural respiratory failure: Secondary | ICD-10-CM | POA: Diagnosis not present

## 2016-07-23 DIAGNOSIS — E785 Hyperlipidemia, unspecified: Secondary | ICD-10-CM | POA: Diagnosis not present

## 2016-07-23 DIAGNOSIS — I7102 Dissection of abdominal aorta: Secondary | ICD-10-CM | POA: Diagnosis not present

## 2016-07-23 DIAGNOSIS — Z978 Presence of other specified devices: Secondary | ICD-10-CM | POA: Diagnosis not present

## 2016-07-23 DIAGNOSIS — I6310 Cerebral infarction due to embolism of unspecified precerebral artery: Secondary | ICD-10-CM | POA: Diagnosis not present

## 2016-07-23 DIAGNOSIS — G8250 Quadriplegia, unspecified: Secondary | ICD-10-CM | POA: Diagnosis not present

## 2016-07-23 DIAGNOSIS — R188 Other ascites: Secondary | ICD-10-CM | POA: Diagnosis not present

## 2016-07-23 DIAGNOSIS — I480 Paroxysmal atrial fibrillation: Secondary | ICD-10-CM | POA: Diagnosis not present

## 2016-07-23 DIAGNOSIS — R9431 Abnormal electrocardiogram [ECG] [EKG]: Secondary | ICD-10-CM | POA: Diagnosis not present

## 2016-07-23 DIAGNOSIS — Q211 Atrial septal defect: Secondary | ICD-10-CM | POA: Diagnosis not present

## 2016-07-23 DIAGNOSIS — Z959 Presence of cardiac and vascular implant and graft, unspecified: Secondary | ICD-10-CM | POA: Diagnosis not present

## 2016-07-23 DIAGNOSIS — J810 Acute pulmonary edema: Secondary | ICD-10-CM | POA: Diagnosis not present

## 2016-07-23 DIAGNOSIS — R29818 Other symptoms and signs involving the nervous system: Secondary | ICD-10-CM | POA: Diagnosis not present

## 2016-07-23 DIAGNOSIS — R079 Chest pain, unspecified: Secondary | ICD-10-CM | POA: Diagnosis not present

## 2016-07-23 DIAGNOSIS — I9789 Other postprocedural complications and disorders of the circulatory system, not elsewhere classified: Secondary | ICD-10-CM | POA: Diagnosis not present

## 2016-07-23 DIAGNOSIS — Z515 Encounter for palliative care: Secondary | ICD-10-CM | POA: Diagnosis not present

## 2016-07-23 DIAGNOSIS — G9341 Metabolic encephalopathy: Secondary | ICD-10-CM | POA: Diagnosis not present

## 2016-07-23 DIAGNOSIS — A498 Other bacterial infections of unspecified site: Secondary | ICD-10-CM | POA: Diagnosis not present

## 2016-07-23 DIAGNOSIS — K219 Gastro-esophageal reflux disease without esophagitis: Secondary | ICD-10-CM | POA: Diagnosis not present

## 2016-07-23 DIAGNOSIS — E872 Acidosis: Secondary | ICD-10-CM | POA: Diagnosis not present

## 2016-07-23 DIAGNOSIS — I6340 Cerebral infarction due to embolism of unspecified cerebral artery: Secondary | ICD-10-CM | POA: Diagnosis not present

## 2016-07-23 DIAGNOSIS — I080 Rheumatic disorders of both mitral and aortic valves: Secondary | ICD-10-CM | POA: Diagnosis not present

## 2016-07-23 DIAGNOSIS — H510 Palsy (spasm) of conjugate gaze: Secondary | ICD-10-CM | POA: Diagnosis not present

## 2016-07-23 DIAGNOSIS — R0602 Shortness of breath: Secondary | ICD-10-CM | POA: Diagnosis not present

## 2016-07-23 DIAGNOSIS — R570 Cardiogenic shock: Secondary | ICD-10-CM | POA: Diagnosis not present

## 2016-07-23 DIAGNOSIS — I513 Intracardiac thrombosis, not elsewhere classified: Secondary | ICD-10-CM | POA: Diagnosis not present

## 2016-07-23 DIAGNOSIS — R918 Other nonspecific abnormal finding of lung field: Secondary | ICD-10-CM | POA: Diagnosis not present

## 2016-07-23 DIAGNOSIS — J90 Pleural effusion, not elsewhere classified: Secondary | ICD-10-CM | POA: Diagnosis not present

## 2016-07-23 DIAGNOSIS — I712 Thoracic aortic aneurysm, without rupture: Secondary | ICD-10-CM | POA: Diagnosis not present

## 2016-07-23 DIAGNOSIS — D696 Thrombocytopenia, unspecified: Secondary | ICD-10-CM | POA: Diagnosis not present

## 2016-07-23 DIAGNOSIS — M47812 Spondylosis without myelopathy or radiculopathy, cervical region: Secondary | ICD-10-CM | POA: Diagnosis not present

## 2016-07-23 DIAGNOSIS — I2510 Atherosclerotic heart disease of native coronary artery without angina pectoris: Secondary | ICD-10-CM | POA: Diagnosis not present

## 2016-07-23 DIAGNOSIS — I119 Hypertensive heart disease without heart failure: Secondary | ICD-10-CM | POA: Diagnosis not present

## 2016-07-23 DIAGNOSIS — R739 Hyperglycemia, unspecified: Secondary | ICD-10-CM | POA: Diagnosis not present

## 2016-07-23 DIAGNOSIS — R930 Abnormal findings on diagnostic imaging of skull and head, not elsewhere classified: Secondary | ICD-10-CM | POA: Diagnosis not present

## 2016-07-23 DIAGNOSIS — I7101 Dissection of thoracic aorta: Secondary | ICD-10-CM | POA: Diagnosis not present

## 2016-07-23 DIAGNOSIS — R0902 Hypoxemia: Secondary | ICD-10-CM | POA: Diagnosis not present

## 2016-07-23 DIAGNOSIS — I639 Cerebral infarction, unspecified: Secondary | ICD-10-CM | POA: Diagnosis not present

## 2016-07-23 DIAGNOSIS — G9340 Encephalopathy, unspecified: Secondary | ICD-10-CM | POA: Diagnosis not present

## 2016-07-23 DIAGNOSIS — R7881 Bacteremia: Secondary | ICD-10-CM | POA: Diagnosis not present

## 2016-07-23 DIAGNOSIS — I711 Thoracic aortic aneurysm, ruptured: Secondary | ICD-10-CM | POA: Diagnosis not present

## 2016-07-23 DIAGNOSIS — G8918 Other acute postprocedural pain: Secondary | ICD-10-CM | POA: Diagnosis not present

## 2016-07-23 DIAGNOSIS — J9601 Acute respiratory failure with hypoxia: Secondary | ICD-10-CM | POA: Diagnosis not present

## 2016-07-23 DIAGNOSIS — M069 Rheumatoid arthritis, unspecified: Secondary | ICD-10-CM | POA: Diagnosis not present

## 2016-07-23 DIAGNOSIS — Z452 Encounter for adjustment and management of vascular access device: Secondary | ICD-10-CM | POA: Diagnosis not present

## 2016-07-23 DIAGNOSIS — I160 Hypertensive urgency: Secondary | ICD-10-CM | POA: Diagnosis not present

## 2016-07-23 DIAGNOSIS — B9689 Other specified bacterial agents as the cause of diseases classified elsewhere: Secondary | ICD-10-CM | POA: Diagnosis not present

## 2016-07-23 DIAGNOSIS — Z66 Do not resuscitate: Secondary | ICD-10-CM | POA: Diagnosis not present

## 2016-07-23 DIAGNOSIS — I6782 Cerebral ischemia: Secondary | ICD-10-CM | POA: Diagnosis not present

## 2016-07-23 DIAGNOSIS — I638 Other cerebral infarction: Secondary | ICD-10-CM | POA: Diagnosis not present

## 2016-07-23 DIAGNOSIS — I7100 Dissection of unspecified site of aorta: Secondary | ICD-10-CM | POA: Diagnosis not present

## 2016-07-23 DIAGNOSIS — R410 Disorientation, unspecified: Secondary | ICD-10-CM | POA: Diagnosis not present

## 2016-07-23 DIAGNOSIS — G9389 Other specified disorders of brain: Secondary | ICD-10-CM | POA: Diagnosis not present

## 2016-07-23 DIAGNOSIS — R9401 Abnormal electroencephalogram [EEG]: Secondary | ICD-10-CM | POA: Diagnosis not present

## 2016-07-23 DIAGNOSIS — N179 Acute kidney failure, unspecified: Secondary | ICD-10-CM | POA: Diagnosis not present

## 2016-07-23 DIAGNOSIS — T8111XA Postprocedural  cardiogenic shock, initial encounter: Secondary | ICD-10-CM | POA: Diagnosis not present

## 2016-07-23 DIAGNOSIS — Z9889 Other specified postprocedural states: Secondary | ICD-10-CM | POA: Diagnosis not present

## 2016-08-29 DEATH — deceased
